# Patient Record
Sex: Female | Born: 1990 | Race: White | Hispanic: No | Marital: Single | Smoking: Never smoker
Health system: Southern US, Community
[De-identification: ages and names within clinical notes are randomized; demographics above are authoritative.]

## PROBLEM LIST (undated history)

## (undated) DIAGNOSIS — F419 Anxiety disorder, unspecified: Secondary | ICD-10-CM

## (undated) DIAGNOSIS — F439 Reaction to severe stress, unspecified: Secondary | ICD-10-CM

## (undated) HISTORY — DX: Reaction to severe stress, unspecified: F43.9

## (undated) HISTORY — DX: Anxiety disorder, unspecified: F41.9

## (undated) HISTORY — PX: BREAST EXCISIONAL BIOPSY: SUR124

## (undated) HISTORY — PX: WISDOM TOOTH EXTRACTION: SHX21

---
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
## 2014-09-28 ENCOUNTER — Emergency Department: Payer: Self-pay | Admitting: Emergency Medicine

## 2016-08-23 DIAGNOSIS — F439 Reaction to severe stress, unspecified: Secondary | ICD-10-CM

## 2016-08-23 HISTORY — DX: Reaction to severe stress, unspecified: F43.9

## 2016-12-02 ENCOUNTER — Ambulatory Visit (INDEPENDENT_AMBULATORY_CARE_PROVIDER_SITE_OTHER): Payer: Managed Care, Other (non HMO)

## 2016-12-02 DIAGNOSIS — Z3042 Encounter for surveillance of injectable contraceptive: Secondary | ICD-10-CM | POA: Diagnosis not present

## 2016-12-02 MED ORDER — MEDROXYPROGESTERONE ACETATE 150 MG/ML IM SUSP
150.0000 mg | Freq: Once | INTRAMUSCULAR | Status: AC
  Administered 2016-12-02: 150 mg via INTRAMUSCULAR

## 2017-02-21 ENCOUNTER — Ambulatory Visit (INDEPENDENT_AMBULATORY_CARE_PROVIDER_SITE_OTHER): Payer: Managed Care, Other (non HMO)

## 2017-02-21 DIAGNOSIS — Z3042 Encounter for surveillance of injectable contraceptive: Secondary | ICD-10-CM | POA: Diagnosis not present

## 2017-02-21 MED ORDER — MEDROXYPROGESTERONE ACETATE 150 MG/ML IM SUSP
150.0000 mg | Freq: Once | INTRAMUSCULAR | Status: AC
  Administered 2017-02-21: 150 mg via INTRAMUSCULAR

## 2017-04-07 LAB — LIPID PANEL
Cholesterol: 124 (ref 0–200)
HDL: 42 (ref 35–70)
LDL Cholesterol: 71
Triglycerides: 55 (ref 40–160)

## 2017-04-07 LAB — BASIC METABOLIC PANEL
Creatinine: 0.9 (ref 0.5–1.1)
Glucose: 89

## 2017-04-07 LAB — HEMOGLOBIN A1C: Hemoglobin A1C: 5.5

## 2017-05-09 ENCOUNTER — Ambulatory Visit (INDEPENDENT_AMBULATORY_CARE_PROVIDER_SITE_OTHER): Payer: Managed Care, Other (non HMO) | Admitting: Family Medicine

## 2017-05-09 ENCOUNTER — Encounter: Payer: Self-pay | Admitting: Family Medicine

## 2017-05-09 VITALS — BP 102/60 | HR 87 | Temp 97.8°F | Resp 16 | Ht 65.5 in | Wt 151.0 lb

## 2017-05-09 DIAGNOSIS — Z7689 Persons encountering health services in other specified circumstances: Secondary | ICD-10-CM

## 2017-05-09 DIAGNOSIS — F411 Generalized anxiety disorder: Secondary | ICD-10-CM | POA: Insufficient documentation

## 2017-05-09 DIAGNOSIS — F329 Major depressive disorder, single episode, unspecified: Secondary | ICD-10-CM | POA: Insufficient documentation

## 2017-05-09 DIAGNOSIS — F419 Anxiety disorder, unspecified: Secondary | ICD-10-CM | POA: Diagnosis not present

## 2017-05-09 DIAGNOSIS — F321 Major depressive disorder, single episode, moderate: Secondary | ICD-10-CM

## 2017-05-09 MED ORDER — BUSPIRONE HCL 5 MG PO TABS: 5.0000 mg | ORAL_TABLET | Freq: Three times a day (TID) | ORAL | 2 refills | Status: DC

## 2017-05-09 MED ORDER — CITALOPRAM HYDROBROMIDE 20 MG PO TABS: 20.0000 mg | ORAL_TABLET | Freq: Every day | ORAL | 3 refills | Status: DC

## 2017-05-09 NOTE — Patient Instructions (Signed)

## 2017-05-09 NOTE — Progress Notes (Signed)
   Patient: Stefanie Fitzgerald Female    DOB: 05/12/1991   26 y.o.   MRN: [id number] Visit Date: 05/09/2017  Today's Provider: Angela Bacigalupo, MD   Chief Complaint  Patient presents with  . Establish Care  . Anxiety   Subjective:       Establish Care Evaleigh presents to establish care. She see's [facility] for GYN and contraception. Her main complaint today is panic attacks.  Anxiety  Presents for initial visit. Episode onset: x 1 month. The problem has been gradually worsening. Symptoms include chest pain (with panic attacks), compulsions (restless legs and having to move them), confusion, decreased concentration, depressed mood, dizziness, excessive worry, hyperventilation, insomnia, irritability, muscle tension, nausea, nervous/anxious behavior, palpitations (with panic), panic and restlessness. Patient reports no dry mouth, feeling of choking, malaise, obsessions, shortness of breath or suicidal ideas. Primary symptoms comment: pt is also c/o hypersomnia and fatigue. The severity of symptoms is moderate, causing significant distress and interfering with daily activities. The symptoms are aggravated by family issues (pt also states she is taking extra activities outside of work, which is also causing anxiety). The quality of sleep is non-restorative.   Risk factors include family history. Past treatments include nothing.   Previously able to separate and organize tasks and stress.  No longer able to do that.  Same stress levels seem more overwhelming.  Also with difficulty concentrating.  Recent episode this weekend of learning stressful news and began shaking and hyperventilating.  Normally this news would be easy to handle.  Denies SI/HI.   GAD 7 : Generalized Anxiety Score 05/09/2017  Nervous, Anxious, on Edge 3  Control/stop worrying 3  Worry too much - different things 3  Trouble relaxing 2  Restless 3  Easily annoyed or irritable 3  Afraid - awful might happen 2    Total GAD 7 Score 19  Anxiety Difficulty Very difficult    Depression screen PHQ 2/9 05/09/2017  Decreased Interest 1  Down, Depressed, Hopeless 0  PHQ - 2 Score 1  Altered sleeping 3  Tired, decreased energy 3  Change in appetite 2  Feeling bad or failure about yourself  2  Trouble concentrating 2  Moving slowly or fidgety/restless 3  Suicidal thoughts 0  PHQ-9 Score 16  Difficult doing work/chores Somewhat difficult      Allergies  Allergen Reactions  . Oxycodone Itching and Swelling     Current Outpatient Prescriptions:  .  medroxyPROGESTERone (DEPO-PROVERA) 150 MG/ML injection, Inject 150 mg into the muscle every 3 (three) months. Administered by [facility], Disp: , Rfl:  .  busPIRone (BUSPAR) 5 MG tablet, Take 1 tablet (5 mg total) by mouth 3 (three) times daily., Disp: 90 tablet, Rfl: 2 .  citalopram (CELEXA) 20 MG tablet, Take 1 tablet (20 mg total) by mouth daily., Disp: 30 tablet, Rfl: 3  Review of Systems  Constitutional: Positive for fatigue and irritability.  Respiratory: Negative for shortness of breath.   Cardiovascular: Positive for chest pain (with panic attacks) and palpitations (with panic).  Gastrointestinal: Positive for nausea.  Neurological: Positive for dizziness, weakness, light-headedness and headaches.  Psychiatric/Behavioral: Positive for confusion, decreased concentration and sleep disturbance. Negative for self-injury and suicidal ideas. The patient is nervous/anxious and has insomnia.     Social History  Substance Use Topics  . Smoking status: Never Smoker  . Smokeless tobacco: Never Used  . Alcohol use Yes     Comment: 1-2 glasses of wine per month   Objective:     BP 102/60 (BP Location: Left Arm, Patient Position: Sitting, Cuff Size: Normal)   Pulse 87   Temp 97.8 F (36.6 C) (Oral)   Resp 16   Ht 5' 5.5" (1.664 m)   Wt 151 lb (68.5 kg)   SpO2 99%   BMI 24.75 kg/m  Vitals:   05/09/17 1414  BP: 102/60  Pulse: 87   Resp: 16  Temp: 97.8 F (36.6 C)  TempSrc: Oral  SpO2: 99%  Weight: 151 lb (68.5 kg)  Height: 5' 5.5" (1.664 m)     Physical Exam  Constitutional: She is oriented to person, place, and time. She appears well-developed and well-nourished. No distress.  HENT:  Head: Normocephalic and atraumatic.  Right Ear: External ear normal.  Left Ear: External ear normal.  Mouth/Throat: Oropharynx is clear and moist.  Eyes: Conjunctivae are normal. No scleral icterus.  Neck: Neck supple. No thyromegaly present.  Cardiovascular: Normal rate, regular rhythm, normal heart sounds and intact distal pulses.   No murmur heard. Pulmonary/Chest: Effort normal and breath sounds normal. No respiratory distress. She has no wheezes. She has no rales.  Abdominal: Soft. She exhibits no distension. There is no tenderness.  Musculoskeletal: She exhibits no edema or deformity.  Lymphadenopathy:    She has no cervical adenopathy.  Neurological: She is alert and oriented to person, place, and time.  Skin: Skin is warm and dry. No rash noted.  Psychiatric: Her behavior is normal. Thought content normal.  +anxious appearing  Vitals reviewed.       Assessment & Plan:      Problem List Items Addressed This Visit      Other   Anxiety    Severe GAD with panic attacks Referral to psychology Start Celexa 20 mg daily as above Also start Buspar 5mg TID prn to help with panic attacks F/u in1 month and consider dose increase Check for any medical causes with TSH, CMP, CBC      Relevant Medications   citalopram (CELEXA) 20 MG tablet   busPIRone (BUSPAR) 5 MG tablet   Other Relevant Orders   TSH   CBC w/Diff/Platelet   CMP14+EGFR   Ambulatory referral to Psychology   Depression, major, single episode, moderate (HCC)    PHQ 9 score in the moderately severe category Discussed starting SSRI Patient agreeable to starting Celexa 20mg daily Discussed potential side effects F/u in 1 month Contracted for  safety regarding possibility of increased suicidality      Relevant Medications   citalopram (CELEXA) 20 MG tablet   busPIRone (BUSPAR) 5 MG tablet   Other Relevant Orders   TSH   CBC w/Diff/Platelet   CMP14+EGFR   Ambulatory referral to Psychology    Other Visit Diagnoses    Encounter to establish care    -  Primary          The entirety of the information documented in the History of Present Illness, Review of Systems and Physical Exam were personally obtained by me. Portions of this information were initially documented by Emily Ratchford, CMA and reviewed by me for thoroughness and accuracy.     Angela Bacigalupo, MD  [facility] 

## 2017-05-09 NOTE — Assessment & Plan Note (Addendum)
Severe GAD with panic attacks Referral to psychology Start Celexa 20 mg daily as above Also start Buspar  TID prn to help with panic attacks F/u in1 month and consider dose increase Check for any medical causes with TSH, CMP, CBC

## 2017-05-09 NOTE — Assessment & Plan Note (Signed)
PHQ 9 score in the moderately severe category Discussed starting SSRI Patient agreeable to starting Celexa  daily Discussed potential side effects F/u in 1 month Contracted for safety regarding possibility of increased suicidality

## 2017-05-12 ENCOUNTER — Encounter: Payer: Self-pay | Admitting: Family Medicine

## 2017-05-14 LAB — CMP14+EGFR
ALT: 9 IU/L (ref 0–32)
AST: 15 IU/L (ref 0–40)
Albumin/Globulin Ratio: 1.8 (ref 1.2–2.2)
Albumin: 4.4 g/dL (ref 3.5–5.5)
Alkaline Phosphatase: 63 IU/L (ref 39–117)
BUN/Creatinine Ratio: 11 (ref 9–23)
BUN: 11 mg/dL (ref 6–20)
Bilirubin Total: 0.7 mg/dL (ref 0.0–1.2)
CO2: 21 mmol/L (ref 20–29)
Calcium: 9.3 mg/dL (ref 8.7–10.2)
Chloride: 104 mmol/L (ref 96–106)
Creatinine, Ser: 0.99 mg/dL (ref 0.57–1.00)
GFR calc Af Amer: 90 mL/min/{1.73_m2} (ref >59)
GFR calc non Af Amer: 78 mL/min/{1.73_m2} (ref >59)
Globulin, Total: 2.4 g/dL (ref 1.5–4.5)
Glucose: 96 mg/dL (ref 65–99)
Potassium: 4.1 mmol/L (ref 3.5–5.2)
Sodium: 139 mmol/L (ref 134–144)
Total Protein: 6.8 g/dL (ref 6.0–8.5)

## 2017-05-14 LAB — CBC WITH DIFFERENTIAL/PLATELET
Basophils Absolute: 0 10*3/uL (ref 0.0–0.2)
Basos: 0 %
EOS (ABSOLUTE): 0.1 10*3/uL (ref 0.0–0.4)
Eos: 1 %
Hematocrit: 38.3 % (ref 34.0–46.6)
Hemoglobin: 12.9 g/dL (ref 11.1–15.9)
Immature Grans (Abs): 0 10*3/uL (ref 0.0–0.1)
Immature Granulocytes: 0 %
Lymphocytes Absolute: 2.6 10*3/uL (ref 0.7–3.1)
Lymphs: 34 %
MCH: 28.5 pg (ref 26.6–33.0)
MCHC: 33.7 g/dL (ref 31.5–35.7)
MCV: 85 fL (ref 79–97)
Monocytes Absolute: 0.5 10*3/uL (ref 0.1–0.9)
Monocytes: 7 %
Neutrophils Absolute: 4.4 10*3/uL (ref 1.4–7.0)
Neutrophils: 58 %
Platelets: 232 10*3/uL (ref 150–379)
RBC: 4.53 x10E6/uL (ref 3.77–5.28)
RDW: 13.8 % (ref 12.3–15.4)
WBC: 7.7 10*3/uL (ref 3.4–10.8)

## 2017-05-14 LAB — TSH: TSH: 1.5 u[IU]/mL (ref 0.450–4.500)

## 2017-05-16 ENCOUNTER — Telehealth: Payer: Self-pay

## 2017-05-16 NOTE — Telephone Encounter (Signed)
-----   Message from Erasmo Downer, MD sent at 05/16/2017  8:28 AM EDT ----- Normal labs - Blood counts, kidney function, liver function, electrolytes, Thyroid function, A1c (so no diabetes), and cholesterol.  Erasmo Downer, MD, MPH [facility] 05/16/2017 8:28 AM

## 2017-05-16 NOTE — Telephone Encounter (Signed)
Pt advised as below

## 2017-05-19 ENCOUNTER — Telehealth: Payer: Self-pay | Admitting: Obstetrics and Gynecology

## 2017-05-19 ENCOUNTER — Ambulatory Visit (INDEPENDENT_AMBULATORY_CARE_PROVIDER_SITE_OTHER): Payer: Managed Care, Other (non HMO)

## 2017-05-19 DIAGNOSIS — Z3042 Encounter for surveillance of injectable contraceptive: Secondary | ICD-10-CM | POA: Diagnosis not present

## 2017-05-19 MED ORDER — MEDROXYPROGESTERONE ACETATE 150 MG/ML IM SUSP: 150.0000 mg | INTRAMUSCULAR | 0 refills | Status: DC

## 2017-05-19 MED ORDER — MEDROXYPROGESTERONE ACETATE 150 MG/ML IM SUSP
150.0000 mg | Freq: Once | INTRAMUSCULAR | Status: AC
  Administered 2017-05-19: 150 mg via INTRAMUSCULAR

## 2017-05-19 NOTE — Telephone Encounter (Signed)
One refill sent in

## 2017-05-19 NOTE — Telephone Encounter (Signed)
Patient needs refill on depo, scheduled for annual/depo on 12/17.

## 2017-06-06 ENCOUNTER — Ambulatory Visit (INDEPENDENT_AMBULATORY_CARE_PROVIDER_SITE_OTHER): Payer: Managed Care, Other (non HMO) | Admitting: Family Medicine

## 2017-06-06 ENCOUNTER — Encounter: Payer: Self-pay | Admitting: Family Medicine

## 2017-06-06 DIAGNOSIS — F321 Major depressive disorder, single episode, moderate: Secondary | ICD-10-CM | POA: Diagnosis not present

## 2017-06-06 DIAGNOSIS — F419 Anxiety disorder, unspecified: Secondary | ICD-10-CM

## 2017-06-06 MED ORDER — CITALOPRAM HYDROBROMIDE 20 MG PO TABS: 20.0000 mg | ORAL_TABLET | Freq: Every day | ORAL | 3 refills | Status: DC

## 2017-06-06 NOTE — Assessment & Plan Note (Signed)
Much improved as per PHQ9 score No SI/HI Continue Celexa daily Follow-up in 3 months

## 2017-06-06 NOTE — Progress Notes (Signed)
Patient: Stefanie Fitzgerald Female    DOB: 1990/09/22   26 y.o.   MRN: [id number] Visit Date: 06/07/2017  Today's Provider: Shirlee Latch, MD   Chief Complaint  Patient presents with  . Anxiety   Subjective:    Anxiety  Presents for follow-up (She was last seen 05/09/2017, and was started on Celexa 20 mg, as well as Buspar 5 mg TID PRN for panic attacks.) visit. Symptoms include compulsions (improving), decreased concentration (improving), insomnia (occasionally), irritability (increased irritability per mother) and nervous/anxious behavior (improved). Patient reports no chest pain, confusion, depressed mood, dizziness, dry mouth, excessive worry, feeling of choking, hyperventilation, malaise, muscle tension, nausea, obsessions, palpitations, panic, restlessness (pt reports her hands were "shaking in a high-intense situation", which occured once.), shortness of breath or suicidal ideas. The severity of symptoms is mild (pt states this is "a lot better"). The quality of sleep is good.   Compliance with medications: pt states the pharmacy did not fill the Buspar, but she has not had a severe panic attack since starting Celexa. Treatment side effects: decreased appetite and weight loss. Pt states she believes this could be a decrease in stress eating.   Panic attacks much improved.  Only one episode of panic and it was mild.  She was able to get help with major medical issue for one of her animals.  States that maternal aunt's grandson was recently diagnosed with Hereditary hemachromatosis. She states that her whole family is doing genetic testing for this. She's not very concerned that she may have this given the very distant relationship between her and this distant cousin.    Allergies  Allergen Reactions  . Oxycodone Itching and Swelling     Current Outpatient Prescriptions:  .  citalopram (CELEXA) 20 MG tablet, Take 1 tablet (20 mg total) by mouth daily., Disp:  90 tablet, Rfl: 3 .  medroxyPROGESTERone (DEPO-PROVERA) 150 MG/ML injection, Inject 1 mL (150 mg total) into the muscle every 3 (three) months. Administered by [facility], Disp: 1 mL, Rfl: 0  Review of Systems  Constitutional: Positive for irritability (increased irritability per mother).  Respiratory: Negative for shortness of breath.   Cardiovascular: Negative for chest pain and palpitations.  Gastrointestinal: Negative for nausea.  Neurological: Negative for dizziness.  Psychiatric/Behavioral: Positive for decreased concentration (improving). Negative for confusion and suicidal ideas. The patient is nervous/anxious (improved) and has insomnia (occasionally).     Social History  Substance Use Topics  . Smoking status: Never Smoker  . Smokeless tobacco: Never Used  . Alcohol use Yes     Comment: 1-2 glasses of wine per month   Objective:   BP 94/60 (BP Location: Left Arm, Patient Position: Sitting, Cuff Size: Normal)   Pulse 68   Temp 98.1 F (36.7 C) (Oral)   Resp 16   Wt 145 lb (65.8 kg)   BMI 23.76 kg/m  Vitals:   06/06/17 1613  BP: 94/60  Pulse: 68  Resp: 16  Temp: 98.1 F (36.7 C)  TempSrc: Oral  Weight: 145 lb (65.8 kg)     Physical Exam  Constitutional: She is oriented to person, place, and time. She appears well-developed and well-nourished. No distress.  Cardiovascular: Normal rate, regular rhythm and normal heart sounds.   No murmur heard. Pulmonary/Chest: Effort normal and breath sounds normal. No respiratory distress. She has no wheezes. She has no rales.  Neurological: She is alert and oriented to person, place, and time.  Psychiatric: She has a  normal mood and affect. Her behavior is normal.  Vitals reviewed.  Depression screen [facility] 2/9 06/06/2017 05/09/2017 05/09/2017  Decreased Interest 0 1 1  Down, Depressed, Hopeless 0 0 0  PHQ - 2 Score 0 1 1  Altered sleeping 1 3 -  Tired, decreased energy 0 3 3  Change in appetite Feeling bad or  failure about yourself  Trouble concentrating 0 2 2  Moving slowly or fidgety/restless Suicidal thoughts 0 0 0  PHQ-9 Score 4 16 -  Difficult doing work/chores - Somewhat difficult Somewhat difficult   GAD 7 : Generalized Anxiety Score 06/06/2017 05/09/2017  Nervous, Anxious, on Edge 0 3  Control/stop worrying 0 3  Worry too much - different things 0 3  Trouble relaxing 0 2  Restless 1 3  Easily annoyed or irritable 0 3  Afraid - awful might happen 0 2  Total GAD 7 Score 1 19  Anxiety Difficulty Not difficult at all Very difficult       Assessment & Plan:      Problem List Items Addressed This Visit      Other   Anxiety    Previously severe now well controlled Panic attacks are now well controlled as well Continue Celexa 20 mg daily Advised to consider therapy Given that she is well controlled and has not started BuSpar, may discontinue and not start this medication Follow-up in 3 months      Relevant Medications   citalopram (CELEXA) 20 MG tablet   Depression, major, single episode, moderate (HCC)    Much improved as per PHQ9 score No SI/HI Continue Celexa daily Follow-up in 3 months      Relevant Medications   citalopram (CELEXA) 20 MG tablet          Return in about 3 months (around 09/06/2017) for CPE.  The entirety of the information documented in the History of Present Illness, Review of Systems and Physical Exam were personally obtained by me. Portions of this information were initially documented by Irving Burton Ratchford, CMA and reviewed by me for thoroughness and accuracy.     Shirlee Latch, MD  [facility]

## 2017-06-06 NOTE — Assessment & Plan Note (Signed)
Previously severe now well controlled Panic attacks are now well controlled as well Continue Celexa 20 mg daily Advised to consider therapy Given that she is well controlled and has not started BuSpar, may discontinue and not start this medication Follow-up in 3 months

## 2017-06-21 ENCOUNTER — Encounter: Payer: Self-pay | Admitting: Psychiatry

## 2017-06-21 ENCOUNTER — Ambulatory Visit (INDEPENDENT_AMBULATORY_CARE_PROVIDER_SITE_OTHER): Payer: 59 | Admitting: Psychiatry

## 2017-06-21 VITALS — BP 99/66 | HR 84 | Temp 98.9°F | Wt 144.0 lb

## 2017-06-21 DIAGNOSIS — F411 Generalized anxiety disorder: Secondary | ICD-10-CM

## 2017-06-21 DIAGNOSIS — F5105 Insomnia due to other mental disorder: Secondary | ICD-10-CM

## 2017-06-21 DIAGNOSIS — F410 Panic disorder [episodic paroxysmal anxiety] without agoraphobia: Secondary | ICD-10-CM | POA: Diagnosis not present

## 2017-06-21 MED ORDER — HYDROXYZINE PAMOATE 25 MG PO CAPS: 25.0000 mg | ORAL_CAPSULE | Freq: Two times a day (BID) | ORAL | 1 refills | Status: DC | PRN

## 2017-06-21 MED ORDER — TRAZODONE HCL 50 MG PO TABS: 25.0000 mg | ORAL_TABLET | Freq: Every evening | ORAL | 1 refills | Status: DC | PRN

## 2017-06-21 NOTE — Progress Notes (Signed)
Psychiatric Initial Adult Assessment   Patient Identification: Stefanie Fitzgerald MRN:  [id number] Date of Evaluation:  06/21/2017 Referral Source: Shirlee Latch MD Chief Complaint:  " I have anxiety attacks.' Chief Complaint    Establish Care; Stress; Insomnia; Panic Attack; Anxiety; Depression     Visit Diagnosis:    ICD-10-CM   1. GAD (generalized anxiety disorder) F41.1   2. Panic disorder F41.0 hydrOXYzine (VISTARIL) 25 MG capsule  3. Insomnia due to mental disorder F51.05 traZODone (DESYREL) 50 MG tablet     History of Present Illness: The right is a 26 year old Caucasian female who is single, employed, lives in [place], has a history of anxiety symptoms who was referred by her primary medical doctor for worsening anxiety attacks.  She is here to establish care.  Maddux reports that she started having anxiety symptoms since the past 1 year or so.  She has been noticing her symptoms as worsening since this past summer.  She reports her symptoms as hyperventilating, feeling restless, feeling fidgety, and very tensed.  Her symptoms peaks in a few minutes and can last for 15 minutes or so.  She reports that when she started having anxiety attacks she used to worry constantly about having another attack.  She would also avoid social situations because she was worried about having attacks.  She reports that she is usually an extrovert.  However after she started having anxiety attacks she started being withdrawn and wanted to avoid people in general.  She notes her stressors as her family, as well as her job situation.  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  However a year ago, her job position was changed to HR.  She reports that soon after that she started getting anxious.  She reports that she does not enjoy her work anymore.  She is currently looking for another position.  She has already talked to her supervisor.  She reports  sleep issues.  She reports that she sleeps fair up to 4 days a week.  But other times she has a lot of racing thoughts and is unable to stay still.  She she used to take NyQuil over-the-counter.  She denies any manic or hypomanic symptoms.  She denies any perceptual disturbances.She denies hx of trauma.  She denies any suicidal thoughts.  She denies any substance abuse issues.  She is currently on Celexa 20 mg which was started by her family medical doctor.  She reports the medication is actually helpful.  Her panic symptoms are not full-blown anymore.  And they are less frequent.  She denies any side effects to her medications.  Associated Signs/Symptoms: Depression Symptoms:  insomnia, difficulty concentrating, (Hypo) Manic Symptoms:  denies Anxiety Symptoms:  Excessive Worry, Panic Symptoms, Psychotic Symptoms:  denies PTSD Symptoms: Negative  Past Psychiatric History: She has a history of anxiety, was started on treatment by her family medical doctor.  She denies past history of suicide attempts.  She denies past history of inpatient mental health admissions.  Previous Psychotropic Medications: Yes  , buspar, celexa  Substance Abuse History in the last 12 months:  No.  Consequences of Substance Abuse: Negative  Past Medical History:  Past Medical History:  Diagnosis Date  . Anxiety     Past Surgical History:  Procedure Laterality Date  . 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 TONSILLECTOMY  1996    Family Psychiatric History: Mother has history of depression and anxiety, brother  has history of drug abuse, depression, anxiety and father has anger issues, alcoholism.  Family History:  Family History  Problem Relation Age of Onset  . Depression Mother   . Anxiety disorder Mother   . Healthy Father   . Anxiety disorder Sister   . Depression Brother   . Anxiety disorder Brother   . Lung cancer Maternal Grandfather        smoker  . Breast cancer Neg Hx   .  Colon cancer Neg Hx     Social History:   Social History   Social History  . Marital status: Single    Spouse name: N/A  . Number of children: 0  . Years of education: bachelor's   Occupational History  .  Lab [employer]   Social History Main Topics  . Smoking status: Never Smoker  . Smokeless tobacco: Never Used  . Alcohol use No     Comment: 1-2 glasses of wine per month  . Drug use: No  . Sexual activity: Not Currently    Birth control/ protection: Injection   Other Topics Concern  . None   Social History Narrative  . None    Additional Social History: Raised by both parents.  She has 1 brother and 1 sister.  She graduated college with a degree in business administration and Architectural technologisthuman resource management.  She is single.  She has a lot of friends.  She has 2 cats and a family of squirrels.  She reports she enjoys her animals. 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  Allergies:   Allergies  Allergen Reactions  . Oxycodone Itching and Swelling    Metabolic Disorder Labs: Lab Results  Component Value Date   HGBA1C 5.5 04/07/2017   No results found for: PROLACTIN Lab Results  Component Value Date   CHOL 124 04/07/2017   TRIG 55 04/07/2017   HDL 42 04/07/2017   LDLCALC 71 04/07/2017     Current Medications: Current Outpatient Prescriptions  Medication Sig Dispense Refill  . citalopram (CELEXA) 20 MG tablet Take 1 tablet (20 mg total) by mouth daily. 90 tablet 3  . medroxyPROGESTERone (DEPO-PROVERA) 150 MG/ML injection Inject 1 mL (150 mg total) into the muscle every 3 (three) months. [facility] 1 mL 0  . hydrOXYzine (VISTARIL) 25 MG capsule Take 1 capsule (25 mg total) by mouth 2 (two) times daily as needed. 45 capsule 1  . traZODone (DESYREL) 50 MG tablet Take 0.5-1 tablets (25-50 mg total) by mouth at bedtime as needed for sleep. 30 tablet 1   No current facility-administered medications for this visit.      Neurologic: Headache: No Seizure: No Paresthesias:No  Musculoskeletal: Strength & Muscle Tone: within normal limits Gait & Station: normal Patient leans: N/A  Psychiatric Specialty Exam: Review of Systems  Psychiatric/Behavioral: Positive for depression. The patient is nervous/anxious and has insomnia.   All other systems reviewed and are negative.   Blood pressure 99/66, pulse 84, temperature 98.9 F (37.2 C), temperature source Oral, weight 144 lb (65.3 kg).Body mass index is 23.6 kg/m.  General Appearance: Casual  Eye Contact:  Fair  Speech:  Clear and Coherent  Volume:  Normal  Mood:  Anxious and Depressed  Affect:  Congruent  Thought Process:  Goal Directed and Descriptions of Associations: Intact  Orientation:  Full (Time, Place, and Person)  Thought Content:  Rumination  Suicidal Thoughts:  No  Homicidal Thoughts:  No  Memory:  Immediate;  Fair Recent;   Fair Remote;   Fair  Judgement:  Fair  Insight:  Fair  Psychomotor Activity:  Normal  Concentration:  Concentration: Fair and Attention Span: Fair  Recall:  [employer] of Knowledge:Fair  Language: Fair  Akathisia:  No  Handed:  Right  AIMS (if indicated):  NA  Assets:  Communication Skills Desire for Improvement  ADL's:  Intact  Cognition: WNL  Sleep:  Poor to fair    Treatment Plan Summary: Calkin is a 26 year old Caucasian female who has a history of anxiety attacks, presented to the clinic today to establish care.  She has stressors of family issues, as well as job stressors.  She is currently compliant on her medication which is actually helpful.  She is also open to start psychotherapy.  She is a good candidate for outpatient treatment. Medication management and Plan see below  Plan For anxiety symptoms Continue Celexa 20 mg p.o. daily Add Vistaril 25 mg p.o. twice daily as needed Refer for CBT.  For panic attacks Vistaril 25 mg p.o. twice daily as needed. CBT.  For insomnia: Trazodone  25 to 50 mg po qhs prn.  Reviewed labs in the EHR, TSH within normal limits( 05/13/2017)  Discussed medications, provided medication education, provided handouts.  Follow-up in 4 weeks.  More than 50 % of the time was spent for psychoeducation and supportive psychotherapy and care coordination.  This note was generated in part or whole with voice recognition software. Voice recognition is usually quite accurate but there are transcription errors that can and very often do occur. I apologize for any typographical errors that were not detected and corrected.      Jomarie Longs, MD 10/30/20182:48 PM

## 2017-06-21 NOTE — Patient Instructions (Signed)
Hydroxyzine capsules or tablets What is this medicine? HYDROXYZINE (hye DROX i zeen) is an antihistamine. This medicine is used to treat allergy symptoms. It is also used to treat anxiety and tension. This medicine can be used with other medicines to induce sleep before surgery. This medicine may be used for other purposes; ask your health care provider or pharmacist if you have questions. COMMON BRAND NAME(S): ANX, Atarax, Rezine, Vistaril What should I tell my health care provider before I take this medicine? They need to know if you have any of these conditions: -any chronic illness -difficulty passing urine -glaucoma -heart disease -kidney disease -liver disease -lung disease -an unusual or allergic reaction to hydroxyzine, cetirizine, other medicines, foods, dyes, or preservatives -pregnant or trying to get pregnant -breast-feeding How should I use this medicine? Take this medicine by mouth with a full glass of water. Follow the directions on the prescription label. You may take this medicine with food or on an empty stomach. Take your medicine at regular intervals. Do not take your medicine more often than directed. Talk to your pediatrician regarding the use of this medicine in children. Special care may be needed. While this drug may be prescribed for children as young as 6 years of age for selected conditions, precautions do apply. Patients over 65 years old may have a stronger reaction and need a smaller dose. Overdosage: If you think you have taken too much of this medicine contact a poison control center or emergency room at once. NOTE: This medicine is only for you. Do not share this medicine with others. What if I miss a dose? If you miss a dose, take it as soon as you can. If it is almost time for your next dose, take only that dose. Do not take double or extra doses. What may interact with this medicine? -alcohol -barbiturate medicines for sleep or seizures -medicines for  colds, allergies -medicines for depression, anxiety, or emotional disturbances -medicines for pain -medicines for sleep -muscle relaxants This list may not describe all possible interactions. Give your health care provider a list of all the medicines, herbs, non-prescription drugs, or dietary supplements you use. Also tell them if you smoke, drink alcohol, or use illegal drugs. Some items may interact with your medicine. What should I watch for while using this medicine? Tell your doctor or health care professional if your symptoms do not improve. You may get drowsy or dizzy. Do not drive, use machinery, or do anything that needs mental alertness until you know how this medicine affects you. Do not stand or sit up quickly, especially if you are an older patient. This reduces the risk of dizzy or fainting spells. Alcohol may interfere with the effect of this medicine. Avoid alcoholic drinks. Your mouth may get dry. Chewing sugarless gum or sucking hard candy, and drinking plenty of water may help. Contact your doctor if the problem does not go away or is severe. This medicine may cause dry eyes and blurred vision. If you wear contact lenses you may feel some discomfort. Lubricating drops may help. See your eye doctor if the problem does not go away or is severe. If you are receiving skin tests for allergies, tell your doctor you are using this medicine. What side effects may I notice from receiving this medicine? Side effects that you should report to your doctor or health care professional as soon as possible: -fast or irregular heartbeat -difficulty passing urine -seizures -slurred speech or confusion -tremor Side effects that   usually do not require medical attention (report to your doctor or health care professional if they continue or are bothersome): -constipation -drowsiness -fatigue -headache -stomach upset This list may not describe all possible side effects. Call your doctor for  medical advice about side effects. You may report side effects to FDA at 1-800-FDA-1088. Where should I keep my medicine? Keep out of the reach of children. Store at room temperature between 15 and 30 degrees C (59 and 86 degrees F). Keep container tightly closed. Throw away any unused medicine after the expiration date. NOTE: This sheet is a summary. It may not cover all possible information. If you have questions about this medicine, talk to your doctor, pharmacist, or health care provider.  2018 Elsevier/Gold Standard (2007-12-22 14:50:59) Trazodone tablets What is this medicine? TRAZODONE (TRAZ oh done) is used to treat depression. This medicine may be used for other purposes; ask your health care provider or pharmacist if you have questions. COMMON BRAND NAME(S): Desyrel What should I tell my health care provider before I take this medicine? They need to know if you have any of these conditions: -attempted suicide or thinking about it -bipolar disorder -bleeding problems -glaucoma -heart disease, or previous heart attack -irregular heart beat -kidney or liver disease -low levels of sodium in the blood -an unusual or allergic reaction to trazodone, other medicines, foods, dyes or preservatives -pregnant or trying to get pregnant -breast-feeding How should I use this medicine? Take this medicine by mouth with a glass of water. Follow the directions on the prescription label. Take this medicine shortly after a meal or a light snack. Take your medicine at regular intervals. Do not take your medicine more often than directed. Do not stop taking this medicine suddenly except upon the advice of your doctor. Stopping this medicine too quickly may cause serious side effects or your condition may worsen. A special MedGuide will be given to you by the pharmacist with each prescription and refill. Be sure to read this information carefully each time. Talk to your pediatrician regarding the use  of this medicine in children. Special care may be needed. Overdosage: If you think you have taken too much of this medicine contact a poison control center or emergency room at once. NOTE: This medicine is only for you. Do not share this medicine with others. What if I miss a dose? If you miss a dose, take it as soon as you can. If it is almost time for your next dose, take only that dose. Do not take double or extra doses. What may interact with this medicine? Do not take this medicine with any of the following medications: -certain medicines for fungal infections like fluconazole, itraconazole, ketoconazole, posaconazole, voriconazole -cisapride -dofetilide -dronedarone -linezolid -MAOIs like Carbex, Eldepryl, Marplan, Nardil, and Parnate -mesoridazine -methylene blue (injected into a vein) -pimozide -saquinavir -thioridazine -ziprasidone This medicine may also interact with the following medications: -alcohol -antiviral medicines for HIV or AIDS -aspirin and aspirin-like medicines -barbiturates like phenobarbital -certain medicines for blood pressure, heart disease, irregular heart beat -certain medicines for depression, anxiety, or psychotic disturbances -certain medicines for migraine headache like almotriptan, eletriptan, frovatriptan, naratriptan, rizatriptan, sumatriptan, zolmitriptan -certain medicines for seizures like carbamazepine and phenytoin -certain medicines for sleep -certain medicines that treat or prevent blood clots like dalteparin, enoxaparin, warfarin -digoxin -fentanyl -lithium -NSAIDS, medicines for pain and inflammation, like ibuprofen or naproxen -other medicines that prolong the QT interval (cause an abnormal heart rhythm) -rasagiline -supplements like St. John's wort, kava kava,   valerian -tramadol -tryptophan This list may not describe all possible interactions. Give your health care provider a list of all the medicines, herbs, non-prescription  drugs, or dietary supplements you use. Also tell them if you smoke, drink alcohol, or use illegal drugs. Some items may interact with your medicine. What should I watch for while using this medicine? Tell your doctor if your symptoms do not get better or if they get worse. Visit your doctor or health care professional for regular checks on your progress. Because it may take several weeks to see the full effects of this medicine, it is important to continue your treatment as prescribed by your doctor. Patients and their families should watch out for new or worsening thoughts of suicide or depression. Also watch out for sudden changes in feelings such as feeling anxious, agitated, panicky, irritable, hostile, aggressive, impulsive, severely restless, overly excited and hyperactive, or not being able to sleep. If this happens, especially at the beginning of treatment or after a change in dose, call your health care professional. You may get drowsy or dizzy. Do not drive, use machinery, or do anything that needs mental alertness until you know how this medicine affects you. Do not stand or sit up quickly, especially if you are an older patient. This reduces the risk of dizzy or fainting spells. Alcohol may interfere with the effect of this medicine. Avoid alcoholic drinks. This medicine may cause dry eyes and blurred vision. If you wear contact lenses you may feel some discomfort. Lubricating drops may help. See your eye doctor if the problem does not go away or is severe. Your mouth may get dry. Chewing sugarless gum, sucking hard candy and drinking plenty of water may help. Contact your doctor if the problem does not go away or is severe. What side effects may I notice from receiving this medicine? Side effects that you should report to your doctor or health care professional as soon as possible: -allergic reactions like skin rash, itching or hives, swelling of the face, lips, or tongue -elevated mood,  decreased need for sleep, racing thoughts, impulsive behavior -confusion -fast, irregular heartbeat -feeling faint or lightheaded, falls -feeling agitated, angry, or irritable -loss of balance or coordination -painful or prolonged erections -restlessness, pacing, inability to keep still -suicidal thoughts or other mood changes -tremors -trouble sleeping -seizures -unusual bleeding or bruising Side effects that usually do not require medical attention (report to your doctor or health care professional if they continue or are bothersome): -change in sex drive or performance -change in appetite or weight -constipation -headache -muscle aches or pains -nausea This list may not describe all possible side effects. Call your doctor for medical advice about side effects. You may report side effects to FDA at 1-800-FDA-1088. Where should I keep my medicine? Keep out of the reach of children. Store at room temperature between 15 and 30 degrees C (59 to 86 degrees F). Protect from light. Keep container tightly closed. Throw away any unused medicine after the expiration date. NOTE: This sheet is a summary. It may not cover all possible information. If you have questions about this medicine, talk to your doctor, pharmacist, or health care provider.  2018 Elsevier/Gold Standard (2016-01-08 16:57:05)  

## 2017-07-11 ENCOUNTER — Ambulatory Visit: Payer: Managed Care, Other (non HMO) | Admitting: Licensed Clinical Social Worker

## 2017-07-19 ENCOUNTER — Encounter: Payer: Self-pay | Admitting: Psychiatry

## 2017-07-19 ENCOUNTER — Other Ambulatory Visit: Payer: Self-pay

## 2017-07-19 ENCOUNTER — Ambulatory Visit: Payer: 59 | Admitting: Psychiatry

## 2017-07-19 VITALS — BP 107/73 | HR 71 | Temp 98.5°F | Wt 145.6 lb

## 2017-07-19 DIAGNOSIS — F411 Generalized anxiety disorder: Secondary | ICD-10-CM

## 2017-07-19 DIAGNOSIS — F410 Panic disorder [episodic paroxysmal anxiety] without agoraphobia: Secondary | ICD-10-CM | POA: Diagnosis not present

## 2017-07-19 DIAGNOSIS — F5105 Insomnia due to other mental disorder: Secondary | ICD-10-CM | POA: Diagnosis not present

## 2017-07-19 NOTE — Progress Notes (Signed)
BH MD/PA/NP OP Progress Note  07/19/2017 3:13 PM Korene Dula  MRN:  [id number]  Chief Complaint:  Chief Complaint    Follow-up; Medication Refill    " I am ok."  HPI: Stefanie Fitzgerald is a 26 year old Caucasian female who is single, employed, lives in [place], has a history of anxiety symptoms who presented to the clinic today for a follow-up visit.  [place] today reports that she has been feeling better since the past 2-3 weeks .  She feels her anxiety symptoms are under control.  She reports she continues to stay busy at work.  But they were able to hire someone new and that has been helpful.  She reports she stays compliant on her medications.  She denies any side effects.  She takes the hydroxyzine as needed occasionally.  She reports sleep is fair.  She has been taking the trazodone couple of times a week.  She usually tries to take it on weekends because she is worried about the hangover effect in the morning.  But overall she feels better .  She reports she had a good Thanksgiving with her family.  Denies any new concerns.  Visit Diagnosis:    ICD-10-CM   1. GAD (generalized anxiety disorder) F41.1   2. 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  She denies past history of suicide attempts.  She denies past history of inpatient mental health admissions.  Past trials of BuSpar.   Past Medical History:  Past Medical History:  Diagnosis Date  . Anxiety     Past Surgical History:  Procedure Laterality Date  . 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 TONSILLECTOMY  1996    Family Psychiatric History: Mother has a history of depression and anxiety; brother has history of drug abuse, depression, anxiety ; father has anger issues and alcoholism.  Family History:  Family History  Problem Relation Age of Onset  .  Depression Mother   . Anxiety disorder Mother   . Healthy Father   . Anxiety disorder Sister   . Depression Brother   . Anxiety disorder Brother   . Lung cancer Maternal Grandfather        smoker  . Breast cancer Neg Hx   . Colon cancer Neg Hx     Social History: Raised by both parents.  She has 1 brother and 1 sister.  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  She is single.  She has a lot of friends.  She has 2 cats and a family of squirrels.  She reports she enjoys her animals .  She currently works in the [employer] department at [employer] History   Socioeconomic History  . Marital status: Single    Spouse name: None  . Number of children: 0  . Years of education: bachelor's  . Highest education level: Bachelor's degree (e.g., BA, AB, BS)  Social Needs  . Financial resource strain: Somewhat hard  . Food insecurity - worry: Never true  . Food insecurity - inability: Never true  . Transportation needs - medical: No  . Transportation needs - non-medical: No  Occupational History    Employer: [employer]    Comment: FULL TIME  Tobacco Use  . Smoking status: Never Smoker  . Smokeless tobacco: Never Used  Substance  and Sexual Activity  . Alcohol use: No    Comment: 1-2 glasses of wine per month  . Drug use: No  . Sexual activity: Not Currently    Birth control/protection: Injection  Other Topics Concern  . None  Social History Narrative  . None    Allergies:  Allergies  Allergen Reactions  . Oxycodone Itching and Swelling    Metabolic Disorder Labs: Lab Results  Component Value Date   HGBA1C 5.5 04/07/2017   No results found for: PROLACTIN Lab Results  Component Value Date   CHOL 124 04/07/2017   TRIG 55 04/07/2017   HDL 42 04/07/2017   LDLCALC 71 04/07/2017   Lab Results  Component Value Date   TSH 1.500 05/13/2017    Therapeutic Level Labs: No results found for: LITHIUM No results found for:  VALPROATE No components found for:  CBMZ  Current Medications: Current Outpatient Medications  Medication Sig Dispense Refill  . citalopram (CELEXA) 20 MG tablet Take 1 tablet (20 mg total) by mouth daily. 90 tablet 3  . hydrOXYzine (VISTARIL) 25 MG capsule Take 1 capsule (25 mg total) by mouth 2 (two) times daily as needed. 45 capsule 1  . medroxyPROGESTERone (DEPO-PROVERA) 150 MG/ML injection Inject 1 mL (150 mg total) into the muscle every 3 (three) months. Administered by [facility] 1 mL 0  . traZODone (DESYREL) 50 MG tablet Take 0.5-1 tablets (25-50 mg total) by mouth at bedtime as needed for sleep. 30 tablet 1   No current facility-administered medications for this visit.      Musculoskeletal: Strength & Muscle Tone: within normal limits Gait & Station: normal Patient leans: N/A  Psychiatric Specialty Exam: Review of Systems  Psychiatric/Behavioral: The patient is nervous/anxious (IMPROVING) and has insomnia (improving).   All other systems reviewed and are negative.   Blood pressure 107/73, pulse 71, temperature 98.5 F (36.9 C), temperature source Oral, weight 145 lb 9.6 oz (66 kg).Body mass index is 23.86 kg/m.  General Appearance: Casual  Eye Contact:  Fair  Speech:  Normal Rate  Volume:  Normal  Mood:  Anxious  Affect:  Congruent  Thought Process:  Goal Directed and Descriptions of Associations: Intact  Orientation:  Full (Time, Place, and Person)  Thought Content: Logical   Suicidal Thoughts:  No  Homicidal Thoughts:  No  Memory:  Immediate;   Fair Recent;   Fair Remote;   Fair  Judgement:  Fair  Insight:  Fair  Psychomotor Activity:  Normal  Concentration:  Concentration: Fair and Attention Span: Fair  Recall:  F[employer]Fair  Fund of Knowledge: Fair  Language: Fair  Akathisia:  No  Handed:  Right  AIMS (if indicated): NA  Assets:  Communication Skills Desire for Improvement Housing Leisure Time Physical Health Social  Support Talents/Skills Transportation  ADL's:  Intact  Cognition: WNL  Sleep:  Fair   Screenings: GAD-7     [facility]  Total GAD-7 Score  1  19    PHQ2-9     [facility]  PHQ-2 Total Score  0  1  PHQ-9 Total Score  4  16       Assessment and Plan: Maralyn SagoSarah is a 26 year old Caucasian female who has a history of anxiety attacks who presented to the clinic for a follow-up visit.  Maralyn SagoSarah reports she is currently doing  well on her medications.  We will continue medication management as noted below.  Plan For anxiety symptoms Continue Celexa 20 mg p.o. daily Vistaril 25 mg p.o. twice daily as needed Patient has been referred for CBT  Panic attacks Continue Vistaril 25 mg p.o. twice daily as needed. CBT referral.  For insomnia Trazodone 25-50 mg p.o. nightly as needed. Also discussed to take the hydroxyzine 25 mg as needed for sleep.  Discussed medications, discussed leisure activities.  Follow-up in 8 weeks or sooner if needed.  This note was generated in part or whole with voice recognition software. Voice recognition is usually quite accurate but there are transcription errors that can and very often do occur. I apologize for any typographical errors that were not detected and corrected.  More than 50 % of the time was spent for psychoeducation and supportive psychotherapy and care coordination.    Jomarie LongsSaramma Janiaya Ryser, MD 07/19/2017, 3:13 PM

## 2017-07-20 ENCOUNTER — Ambulatory Visit: Payer: Managed Care, Other (non HMO) | Admitting: Psychiatry

## 2017-08-08 ENCOUNTER — Ambulatory Visit (INDEPENDENT_AMBULATORY_CARE_PROVIDER_SITE_OTHER): Payer: Managed Care, Other (non HMO) | Admitting: Obstetrics and Gynecology

## 2017-08-08 ENCOUNTER — Encounter: Payer: Self-pay | Admitting: Obstetrics and Gynecology

## 2017-08-08 ENCOUNTER — Ambulatory Visit: Payer: 59 | Admitting: Licensed Clinical Social Worker

## 2017-08-08 VITALS — BP 100/64 | HR 78 | Ht 66.0 in | Wt 148.0 lb

## 2017-08-08 DIAGNOSIS — Z124 Encounter for screening for malignant neoplasm of cervix: Secondary | ICD-10-CM

## 2017-08-08 DIAGNOSIS — Z3042 Encounter for surveillance of injectable contraceptive: Secondary | ICD-10-CM

## 2017-08-08 DIAGNOSIS — Z01419 Encounter for gynecological examination (general) (routine) without abnormal findings: Secondary | ICD-10-CM | POA: Diagnosis not present

## 2017-08-08 LAB — HM PAP SMEAR

## 2017-08-08 MED ORDER — MEDROXYPROGESTERONE ACETATE 150 MG/ML IM SUSP: 150.0000 mg | INTRAMUSCULAR | 3 refills | Status: DC

## 2017-08-08 MED ORDER — MEDROXYPROGESTERONE ACETATE 150 MG/ML IM SUSP: 150.0000 mg | INTRAMUSCULAR | 0 refills | Status: DC

## 2017-08-08 NOTE — Patient Instructions (Signed)
I value your feedback and entrusting us with your care. If you get a  patient survey, I would appreciate you taking the time to let us know about your experience today. Thank you! 

## 2017-08-08 NOTE — Progress Notes (Signed)
PCP:  Erasmo DownerBacigalupo, Angela M, MD   Chief Complaint  Patient presents with  . Gynecologic Exam     HPI:      [name] is a 26 y.o. G0P0000 who LMP was No LMP recorded. Patient has had an injection., presents today for her annual examination.  Her menses are absent due to depo.  Dysmenorrhea none. She does not have intermenstrual bleeding.  Sex activity: not sexually active.  Last Pap: June 29, 2016  Results were: no abnormalities  Hx of STDs: chlamydia  There is no FH of breast cancer. There is no FH of ovarian cancer. The patient does do self-breast exams.  Tobacco use: The patient denies current or previous tobacco use. Alcohol use: social drinker No drug use.  Exercise: moderately active  She does get adequate calcium and Vitamin D in her diet.   Past Medical History:  Diagnosis Date  . Anxiety     Past Surgical History:  Procedure Laterality Date  . 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 TONSILLECTOMY  1996    Family History  Problem Relation Age of Onset  . Depression Mother   . Anxiety disorder Mother   . Healthy Father   . Anxiety disorder Sister   . Depression Brother   . Anxiety disorder Brother   . Lung cancer Maternal Grandfather        smoker  . Breast cancer Neg Hx   . Colon cancer Neg Hx     Social History   Socioeconomic History  . Marital status: Single    Spouse name: Not on file  . Number of children: 0  . Years of education: bachelor's  . Highest education level: Bachelor's degree (e.g., BA, AB, BS)  Social Needs  . Financial resource strain: Somewhat hard  . Food insecurity - worry: Never true  . Food insecurity - inability: Never true  . Transportation needs - medical: No  . Transportation needs - non-medical: No  Occupational History    Employer: [employer]    Comment: FULL TIME  Tobacco Use  . Smoking status: Never Smoker  . Smokeless tobacco: Never Used  Substance and Sexual Activity    . Alcohol use: No    Comment: 1-2 glasses of wine per month  . Drug use: No  . Sexual activity: Not Currently    Birth control/protection: Injection  Other Topics Concern  . Not on file  Social History Narrative  . Not on file    Current Meds  Medication Sig  . citalopram (CELEXA) 20 MG tablet Take 1 tablet (20 mg total) by mouth daily.  . hydrOXYzine (VISTARIL) 25 MG capsule Take 1 capsule (25 mg total) by mouth 2 (two) times daily as needed.  . medroxyPROGESTERone (DEPO-PROVERA) 150 MG/ML injection Inject 1 mL (150 mg total) into the muscle every 3 (three) months. [facility]  . [DISCONTINUED] medroxyPROGESTERone (DEPO-PROVERA) 150 MG/ML injection Inject 1 mL (150 mg total) into the muscle every 3 (three) months. [facility]  . [DISCONTINUED] medroxyPROGESTERone (DEPO-PROVERA) 150 MG/ML injection Inject 1 mL (150 mg total) into the muscle every 3 (three) months. [facility] by [facility]     ROS:  Review of Systems  Constitutional: Negative for fatigue, fever and unexpected weight change.  Respiratory: Negative for cough, shortness of breath and wheezing.   Cardiovascular: Negative for chest pain, palpitations and leg swelling.  Gastrointestinal: Negative for blood in stool, constipation,  diarrhea, nausea and vomiting.  Endocrine: Negative for cold intolerance, heat intolerance and polyuria.  Genitourinary: Negative for dyspareunia, dysuria, flank pain, frequency, genital sores, hematuria, menstrual problem, pelvic pain, urgency, vaginal bleeding, vaginal discharge and vaginal pain.  Musculoskeletal: Negative for back pain, joint swelling and myalgias.  Skin: Negative for rash.  Neurological: Negative for dizziness, syncope, light-headedness, numbness and headaches.  Hematological: Negative for adenopathy.  Psychiatric/Behavioral: Negative for agitation, confusion, sleep disturbance and suicidal ideas. The patient is not  nervous/anxious.      Objective: BP 100/64 (BP Location: Left Arm, Patient Position: Sitting, Cuff Size: Normal)   Pulse 78   Ht 5\' 6"  (1.676 m)   Wt 148 lb (67.1 kg)   BMI 23.89 kg/m    Physical Exam  Constitutional: She is oriented to person, place, and time. She appears well-developed and well-nourished.  Genitourinary: Vagina normal and uterus normal. There is no rash or tenderness on the right labia. There is no rash or tenderness on the left labia. No erythema or tenderness in the vagina. No vaginal discharge found. Right adnexum does not display mass and does not display tenderness. Left adnexum does not display mass and does not display tenderness. Cervix does not exhibit motion tenderness or polyp. Uterus is not enlarged or tender.  Neck: Normal range of motion. No thyromegaly present.  Cardiovascular: Normal rate, regular rhythm and normal heart sounds.  No murmur heard. Pulmonary/Chest: Effort normal and breath sounds normal. Right breast exhibits no mass, no nipple discharge, no skin change and no tenderness. Left breast exhibits no mass, no nipple discharge, no skin change and no tenderness.  Abdominal: Soft. There is no tenderness. There is no guarding.  Musculoskeletal: Normal range of motion.  Neurological: She is alert and oriented to person, place, and time. No cranial nerve deficit.  Psychiatric: She has a normal mood and affect. Her behavior is normal.  Vitals reviewed.   Assessment/Plan: Encounter for annual routine gynecological examination  Cervical cancer screening - Plan: IGP, rfx Aptima HPV ASCU  Encounter for surveillance of injectable contraceptive - Depo RF. Cont calcium.  - Plan: medroxyPROGESTERone (DEPO-PROVERA) 150 MG/ML injection, DISCONTINUED: medroxyPROGESTERone (DEPO-PROVERA) 150 MG/ML injection  Meds ordered this encounter  Medications  . DISCONTD: medroxyPROGESTERone (DEPO-PROVERA) 150 MG/ML injection    Sig: Inject 1 mL (150 mg total) into  the muscle every 3 (three) months. [facility] by [facility]    Dispense:  1 mL    Refill:  0  . medroxyPROGESTERone (DEPO-PROVERA) 150 MG/ML injection    Sig: Inject 1 mL (150 mg total) into the muscle every 3 (three) months. [facility]    Dispense:  1 mL    Refill:  3             GYN counsel adequate intake of calcium and vitamin D     F/U  Return in about 1 year (around 08/08/2018).  Daejon Lich B. Shalaunda Weatherholtz, PA-C 08/08/2017 4:35 PM

## 2017-08-09 LAB — IGP, RFX APTIMA HPV ASCU: PAP Smear Comment: 0

## 2017-08-12 ENCOUNTER — Ambulatory Visit (INDEPENDENT_AMBULATORY_CARE_PROVIDER_SITE_OTHER): Payer: Managed Care, Other (non HMO)

## 2017-08-12 DIAGNOSIS — Z3042 Encounter for surveillance of injectable contraceptive: Secondary | ICD-10-CM | POA: Diagnosis not present

## 2017-08-12 MED ORDER — MEDROXYPROGESTERONE ACETATE 150 MG/ML IM SUSP
150.0000 mg | Freq: Once | INTRAMUSCULAR | Status: AC
  Administered 2017-08-12: 150 mg via INTRAMUSCULAR

## 2017-08-30 ENCOUNTER — Ambulatory Visit: Payer: 59 | Admitting: Licensed Clinical Social Worker

## 2017-08-30 DIAGNOSIS — F410 Panic disorder [episodic paroxysmal anxiety] without agoraphobia: Secondary | ICD-10-CM | POA: Diagnosis not present

## 2017-08-30 DIAGNOSIS — F411 Generalized anxiety disorder: Secondary | ICD-10-CM | POA: Diagnosis not present
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
## 2017-09-07 ENCOUNTER — Ambulatory Visit (INDEPENDENT_AMBULATORY_CARE_PROVIDER_SITE_OTHER): Payer: Managed Care, Other (non HMO) | Admitting: Family Medicine

## 2017-09-07 ENCOUNTER — Encounter: Payer: Self-pay | Admitting: Family Medicine

## 2017-09-07 VITALS — BP 98/70 | HR 71 | Temp 98.2°F | Resp 16 | Ht 66.0 in | Wt 150.2 lb

## 2017-09-07 DIAGNOSIS — F419 Anxiety disorder, unspecified: Secondary | ICD-10-CM

## 2017-09-07 DIAGNOSIS — R42 Dizziness and giddiness: Secondary | ICD-10-CM | POA: Insufficient documentation

## 2017-09-07 DIAGNOSIS — Z0000 Encounter for general adult medical examination without abnormal findings: Secondary | ICD-10-CM | POA: Diagnosis not present

## 2017-09-07 DIAGNOSIS — F321 Major depressive disorder, single episode, moderate: Secondary | ICD-10-CM | POA: Diagnosis not present

## 2017-09-07 NOTE — Assessment & Plan Note (Signed)
Fairly well controlled No further panic attacks Possible that episodes of dizziness, diaphoresis could be related to panic attacks, but patient declines feeling stressed or anxious when these occur Continue Celexa 20mg  daily F/u in 6 months

## 2017-09-07 NOTE — Assessment & Plan Note (Signed)
Much improved Continue Celexa 20 mg daily F/u in 6 months

## 2017-09-07 NOTE — Assessment & Plan Note (Signed)
Episodic dizziness, diaphoresis, nausea, etc No correlation of symptoms to triggers No chest pain, palpitations, or SOB associated, so cardiac etiology is lower on differential  Most likely causes are hypotension due to dehydration or hypoglycemia after spikes from eating high carb meals No neuro symptoms to suggest serotonin syndrome and only taking Celexa - no other meds Advised keeping log of symptoms and checking BP and HR when episodes occur F/u in 6 wks Could consider cardiology referral for possible Holter

## 2017-09-07 NOTE — Patient Instructions (Signed)
Preventive Care 18-39 Years, Female Preventive care refers to lifestyle choices and visits with your health care provider that can promote health and wellness. What does preventive care include?  A yearly physical exam. This is also called an annual well check.  Dental exams once or twice a year.  Routine eye exams. Ask your health care provider how often you should have your eyes checked.  Personal lifestyle choices, including: ? Daily care of your teeth and gums. ? Regular physical activity. ? Eating a healthy diet. ? Avoiding tobacco and drug use. ? Limiting alcohol use. ? Practicing safe sex. ? Taking vitamin and mineral supplements as recommended by your health care provider. What happens during an annual well check? The services and screenings done by your health care provider during your annual well check will depend on your age, overall health, lifestyle risk factors, and family history of disease. Counseling Your health care provider may ask you questions about your:  Alcohol use.  Tobacco use.  Drug use.  Emotional well-being.  Home and relationship well-being.  Sexual activity.  Eating habits.  Work and work Statistician.  Method of birth control.  Menstrual cycle.  Pregnancy history.  Screening You may have the following tests or measurements:  Height, weight, and BMI.  Diabetes screening. This is done by checking your blood sugar (glucose) after you have not eaten for a while (fasting).  Blood pressure.  Lipid and cholesterol levels. These may be checked every 5 years starting at age 66.  Skin check.  Hepatitis C blood test.  Hepatitis B blood test.  Sexually transmitted disease (STD) testing.  BRCA-related cancer screening. This may be done if you have a family history of breast, ovarian, tubal, or peritoneal cancers.  Pelvic exam and Pap test. This may be done every 3 years starting at age 40. Starting at age 59, this may be done every 5  years if you have a Pap test in combination with an HPV test.  Discuss your test results, treatment options, and if necessary, the need for more tests with your health care provider. Vaccines Your health care provider may recommend certain vaccines, such as:  Influenza vaccine. This is recommended every year.  Tetanus, diphtheria, and acellular pertussis (Tdap, Td) vaccine. You may need a Td booster every 10 years.  Varicella vaccine. You may need this if you have not been vaccinated.  HPV vaccine. If you are 69 or younger, you may need three doses over 6 months.  Measles, mumps, and rubella (MMR) vaccine. You may need at least one dose of MMR. You may also need a second dose.  Pneumococcal 13-valent conjugate (PCV13) vaccine. You may need this if you have certain conditions and were not previously vaccinated.  Pneumococcal polysaccharide (PPSV23) vaccine. You may need one or two doses if you smoke cigarettes or if you have certain conditions.  Meningococcal vaccine. One dose is recommended if you are age 27-21 years and a first-year college student living in a residence hall, or if you have one of several medical conditions. You may also need additional booster doses.  Hepatitis A vaccine. You may need this if you have certain conditions or if you travel or work in places where you may be exposed to hepatitis A.  Hepatitis B vaccine. You may need this if you have certain conditions or if you travel or work in places where you may be exposed to hepatitis B.  Haemophilus influenzae type b (Hib) vaccine. You may need this if  you have certain risk factors.  Talk to your health care provider about which screenings and vaccines you need and how often you need them. This information is not intended to replace advice given to you by your health care provider. Make sure you discuss any questions you have with your health care provider. Document Released: 10/05/2001 Document Revised: 04/28/2016  Document Reviewed: 06/10/2015 Elsevier Interactive Patient Education  [employer].

## 2017-09-07 NOTE — Progress Notes (Signed)
Patient: Stefanie Fitzgerald, Female    DOB: [birth year], 27 y.o.   MRN: [id number] Visit Date: 09/07/2017  Today's Provider: Lavon Paganini, MD   I, Martha Clan, CMA, am acting as scribe for Lavon Paganini, MD.  Chief Complaint  Patient presents with  . Annual Exam   Subjective:    Annual physical exam Stefanie Fitzgerald is a 27 y.o. female who presents today for health maintenance and complete physical. She feels fairly well. She reports exercising for 30 minutes 3 days a week. She reports she is sleeping poorly. Pt states she has been falling asleep about 5:00 pm on the couch most nights, and wakes up around 3 hours later. This makes it difficult to fall back asleep at bedtime.  She states she has been feeling poorly over the last month. She is c/o fatigue, malaise, cold/clammy sweats, dizziness, lightheadedness, weakness, nausea, and headaches. Denies syncope, chest pain, SOB, palpitations. States it is difficult to climb stairs without feeling lightheaded and dizzy. This has happened once or twice.  Walking more and eating snacks through the day seems to help some.  Can happen randomly through the day.  Trying to increase water intake, but this is difficult because she does not like water.  Last pap- 08/08/2017- NIL. F/B GYN Unsure of last TDAP, but doesn't think it has been recently. ----------------------------------------------------------------- Depression screen [employer] 2/9 09/07/2017 06/06/2017 05/09/2017 05/09/2017  Decreased Interest 0 0 1 1  Down, Depressed, Hopeless 0 0 0 0  PHQ - 2 Score 0 0 1 1  Altered sleeping 3 1 3  -  Tired, decreased energy 2 0 3 3  Change in appetite 0 1 2 3   Feeling bad or failure about yourself  0 1 2 2   Trouble concentrating 0 0 2 2  Moving slowly or fidgety/restless 2 1 3 2   Suicidal thoughts 0 0 0 0  PHQ-9 Score 7 4 16  -  Difficult doing work/chores Somewhat difficult - Somewhat difficult Somewhat  difficult    GAD 7 : Generalized Anxiety Score 09/07/2017 06/06/2017 05/09/2017  Nervous, Anxious, on Edge 1 0 3  Control/stop worrying 0 0 3  Worry too much - different things 1 0 3  Trouble relaxing 0 0 2  Restless 0 1 3  Easily annoyed or irritable 1 0 3  Afraid - awful might happen 0 0 2  Total GAD 7 Score 3 1 19   Anxiety Difficulty Not difficult at all Not difficult at all Very difficult      Review of Systems  Constitutional: Positive for fatigue. Negative for activity change, appetite change, chills, diaphoresis, fever and unexpected weight change.  HENT: Negative.   Eyes: Negative.   Respiratory: Negative.   Cardiovascular: Negative.   Gastrointestinal: Negative.   Endocrine: Negative.   Genitourinary: Negative.   Musculoskeletal: Negative.   Skin: Negative.   Allergic/Immunologic: Negative.   Neurological: Positive for dizziness, weakness, light-headedness and headaches. Negative for tremors, seizures, syncope, facial asymmetry, speech difficulty and numbness.  Hematological: Negative.   Psychiatric/Behavioral: Negative.     Social History      She  reports that  has never smoked. she has never used smokeless tobacco. She reports that she drinks alcohol. She reports that she does not use drugs.       Social History   Socioeconomic History  . Marital status: Single    Spouse name: None  . Number of children: 0  . Years of education: bachelor's  .  Highest education level: Bachelor's degree (e.g., BA, AB, BS)  Social Needs  . Financial resource strain: Somewhat hard  . Food insecurity - worry: Never true  . Food insecurity - inability: Never true  . Transportation needs - medical: No  . Transportation needs - non-medical: No  Occupational History    Employer: [employer]    Comment: FULL TIME  Tobacco Use  . Smoking status: Never Smoker  . Smokeless tobacco: Never Used  Substance and Sexual Activity  . Alcohol use: Yes    Comment: 1-2 glasses of wine per  month  . Drug use: No  . Sexual activity: Not Currently    Birth control/protection: Injection  Other Topics Concern  . None  Social History Narrative  . None    Past Medical History:  Diagnosis Date  . Anxiety      Patient Active Problem List   Diagnosis Date Noted  . Dizziness 09/07/2017  . Anxiety 05/09/2017  . Depression, major, single episode, moderate ([place]) 05/09/2017    Past Surgical History:  Procedure Laterality Date  . 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 TONSILLECTOMY  1996    Family History        Family Status  Relation Name Status  . Mother  Alive  . Father  Alive  . Sister  Alive  . Brother  Alive  . MGF  (Not Specified)  . Neg Hx  (Not Specified)        Her family history includes Anxiety disorder in her brother, mother, and sister; Depression in her brother and mother; Healthy in her father; Lung cancer in her maternal grandfather.     Allergies  Allergen Reactions  . Oxycodone Itching and Swelling     Current Outpatient Medications:  .  citalopram (CELEXA) 20 MG tablet, Take 1 tablet (20 mg total) by mouth daily., Disp: 90 tablet, Rfl: 3 .  medroxyPROGESTERone (DEPO-PROVERA) 150 MG/ML injection, Inject 1 mL (150 mg total) into the muscle every 3 (three) months. Administered by [facility], Disp: 1 mL, Rfl: 3   Patient Care Team: Virginia Crews, MD as PCP - General (Family Medicine)      Objective:   Vitals: BP 98/70 (BP Location: Left Arm, Patient Position: Sitting, Cuff Size: Normal)   Pulse 71   Temp 98.2 F (36.8 C) (Oral)   Resp 16   Ht 5' 6"  (1.676 m)   Wt 150 lb 3.2 oz (68.1 kg)   SpO2 96%   BMI 24.24 kg/m    Vitals:   09/07/17 1519  BP: 98/70  Pulse: 71  Resp: 16  Temp: 98.2 F (36.8 C)  TempSrc: Oral  SpO2: 96%  Weight: 150 lb 3.2 oz (68.1 kg)  Height: 5' 6"  (1.676 m)     Physical Exam  Constitutional: She is oriented to person, place, and time. She appears well-developed and  well-nourished. No distress.  HENT:  Head: Normocephalic and atraumatic.  Right Ear: External ear normal.  Left Ear: External ear normal.  Nose: Nose normal.  Mouth/Throat: Oropharynx is clear and moist.  Eyes: Conjunctivae and EOM are normal. Pupils are equal, round, and reactive to light. No scleral icterus.  Neck: Neck supple. No thyromegaly present.  Cardiovascular: Normal rate, regular rhythm, normal heart sounds and intact distal pulses.  No murmur heard. Pulmonary/Chest: Effort normal and breath sounds normal. No respiratory distress. She has no wheezes. She has no rales.  Abdominal: Soft. Bowel sounds are normal. She  exhibits no distension. There is no tenderness. There is no rebound and no guarding.  Musculoskeletal: She exhibits no edema or deformity.  Lymphadenopathy:    She has no cervical adenopathy.  Neurological: She is alert and oriented to person, place, and time. No cranial nerve deficit. She exhibits normal muscle tone. Coordination normal.  Skin: Skin is warm and dry. No rash noted.  Psychiatric: She has a normal mood and affect. Her behavior is normal.  Vitals reviewed.    Depression Screen PHQ 2/9 Scores 09/07/2017 06/06/2017 05/09/2017 05/09/2017  PHQ - 2 Score 0 0 1 1  PHQ- 9 Score 7 4 16  -      Assessment & Plan:     Routine Health Maintenance and Physical Exam  Exercise Activities and Dietary recommendations Goals    None      Immunization History  Administered Date(s) Administered  . DTaP 10/26/1990, 10/18/1991, 12/21/1991, 07/08/1992, 03/23/1994  . Hepatitis B 06/02/2001, 07/07/2001  . HiB (PRP-OMP) 10/26/1990, 10/18/1991  . IPV 10/26/1990, 10/18/1991, 12/21/1991, 03/23/1994  . MMR 10/18/1991, 03/23/1994    Health Maintenance  Topic Date Due  . HIV Screening  03/22/2005  . TETANUS/TDAP  03/22/2009  . INFLUENZA VACCINE  11/20/2017 (Originally 03/23/2017)  . PAP SMEAR  08/08/2020     Discussed health benefits of physical activity, and  encouraged her to engage in regular exercise appropriate for her age and condition.  - Patient declines TDAP vaccination today - will bring records to next visit.    Problem List Items Addressed This Visit      Other   Anxiety    Fairly well controlled No further panic attacks Possible that episodes of dizziness, diaphoresis could be related to panic attacks, but patient declines feeling stressed or anxious when these occur Continue Celexa 26m daily F/u in 6 months      Depression, major, single episode, moderate ([facility])    Much improved Continue Celexa 20 mg daily F/u in 6 months      Dizziness    Episodic dizziness, diaphoresis, nausea, etc No correlation of symptoms to triggers No chest pain, palpitations, or SOB associated, so cardiac etiology is lower on differential  Most likely causes are hypotension due to dehydration or hypoglycemia after spikes from eating high carb meals No neuro symptoms to suggest serotonin syndrome and only taking Celexa - no other meds Advised keeping log of symptoms and checking BP and HR when episodes occur F/u in 6 wks Could consider cardiology referral for possible Holter       Other Visit Diagnoses    Encounter for annual physical exam    -  Primary      Return in about 6 weeks (around 10/19/2017) for dizziness f/u.  --------------------------------------------------------------------  The entirety of the information documented in the History of Present Illness, Review of Systems and Physical Exam were personally obtained by me. Portions of this information were initially documented by ERaquel SarnaRatchford, CMA and reviewed by me for thoroughness and accuracy.    BVirginia Crews MD, MPH [facility]1/16/2019 4:54 PM

## 2017-09-15 ENCOUNTER — Ambulatory Visit: Payer: 59 | Admitting: Psychiatry

## 2017-09-27 ENCOUNTER — Ambulatory Visit: Payer: 59 | Admitting: Licensed Clinical Social Worker

## 2017-09-27 DIAGNOSIS — F411 Generalized anxiety disorder: Secondary | ICD-10-CM | POA: Diagnosis not present

## 2017-10-19 ENCOUNTER — Ambulatory Visit: Payer: 59 | Admitting: Licensed Clinical Social Worker

## 2017-10-20 NOTE — Progress Notes (Signed)
  THERAPIST PROGRESS NOTE   Date of Service:   09/27/2017  Session Time:   1hour  Patient:   Stefanie Fitzgerald   DOB:   [birth year]  MR Number:  [id number]  Location:  [facility] [place] [facility] [place] [address] Dept: [phone]            Provider/Observer:  Lubertha South Counselor  Risk of Suicide/Violence: virtually non-existent   Diagnosis:    GAD (generalized anxiety disorder)  Type of Therapy: Individual Therapy  Treatment Goals addressed: Anxiety and Coping  Participation Level: Active  Interventions: CBT and Motivational Interviewing   Behavioral Response: CasualAlertEuthymic   Summary: Therapist met with Patient in an initial therapy session to assess current mood and to build rapport. Therapist engaged Patient in discussion about her life and what is going well for her. Therapist provided support for Patient as she shared details about her life, her current stressors, mood, coping skills, her past, and her children. Therapist prompted Patient to discuss her support system and ways that she manages her daily stress, anger, and frustrations.  LCSW discussed what psychotherapy is and is not and the importance of the therapeutic relationship to include open and honest communication between client and therapist and building trust.  Reviewed advantages and disadvantages of the therapeutic process and limitations to the therapeutic relationship including LCSW's role in maintaining the safety of the client, others and those in client's care. Therapist asked consumer what the term "imagery for remaining calm" means.  Therapist asked consumer if he feels he is good at calming down when upset.  Therapist reminded consumer that if she understands her trigger points then they can calm down quicker.  Therapist introduced an activity to assist with remaining  calm in given situations.  Therapist explained seeing things that upset you can make you feel tense.     Plan: Vertis Bauder Farrell-Buchanan will use coping skills to manage symptoms.   Return again in 2 weeks.

## 2017-10-21 ENCOUNTER — Encounter: Payer: Self-pay | Admitting: Family Medicine

## 2017-10-21 ENCOUNTER — Ambulatory Visit (INDEPENDENT_AMBULATORY_CARE_PROVIDER_SITE_OTHER): Payer: Managed Care, Other (non HMO) | Admitting: Family Medicine

## 2017-10-21 VITALS — BP 118/60 | HR 85 | Temp 97.9°F | Resp 16 | Wt 150.0 lb

## 2017-10-21 DIAGNOSIS — R42 Dizziness and giddiness: Secondary | ICD-10-CM | POA: Diagnosis not present

## 2017-10-21 DIAGNOSIS — F419 Anxiety disorder, unspecified: Secondary | ICD-10-CM | POA: Diagnosis not present

## 2017-10-21 NOTE — Assessment & Plan Note (Signed)
Fairly well controlled, no more panic attacks Has been somewhat exacerbated by recent life stressors Discussed options of increasing Celexa to higher dose, but mutual decision made to continue current dose F/u in 6 months

## 2017-10-21 NOTE — Assessment & Plan Note (Signed)
Now resolved May have been related to illness that started soon after or dehydration Return precautions discussed

## 2017-10-21 NOTE — Progress Notes (Signed)
Patient: Stefanie FlemingsSara Elizabeth Farrell-Buchanan Female    DOB: 10-08-1990   27 y.o.   MRN: [id number] Visit Date: 10/21/2017  Today's Provider: Shirlee LatchAngela Taheem Fricke, MD   I, Joslyn HyEmily Ratchford, CMA, am acting as scribe for Shirlee LatchAngela Etheridge Geil, MD.  Chief Complaint  Patient presents with  . Dizziness  . Anxiety   Subjective:    HPI     Follow up for Dizziness  The patient was last seen for this 6 weeks ago. Changes made at last visit include advising pt to keep log of symptoms, and checking BP and HR when episodes occur.  She states she has not had any more dizzy spells since LOV. She is has been sick twice sicne last being seen; once with a URI and once with the GI bug. She wonders if the dizziness was a sx of oncoming illness.  ------------------------------------------------------------------------------------ Anxiety Pt states this is well controlled, despite having triggering life events. She just learned that one of her co-workers from her previous job has passed away yesterday. She also had to put down her cat. She reports she was not taking her Celexa while she was sick, but has restarted the medication.   Has started seeing a therapist and is learning more coping skills.  Now is going to be going about once a month.   Allergies  Allergen Reactions  . Oxycodone Itching and Swelling     Current Outpatient Medications:  .  citalopram (CELEXA) 20 MG tablet, Take 1 tablet (20 mg total) by mouth daily., Disp: 90 tablet, Rfl: 3 .  medroxyPROGESTERone (DEPO-PROVERA) 150 MG/ML injection, Inject 1 mL (150 mg total) into the muscle every 3 (three) months. Administ[facility] by [facility], Disp: 1 mL, Rfl: 3  Review of Systems  Constitutional: Negative for activity change, appetite change, chills, diaphoresis, fatigue, fever and unexpected weight change.  Respiratory: Negative for shortness of breath.   Cardiovascular: Negative for chest pain, palpitations and leg swelling.    Neurological: Negative for dizziness and light-headedness.  Psychiatric/Behavioral: The patient is nervous/anxious.     Social History   Tobacco Use  . Smoking status: Never Smoker  . Smokeless tobacco: Never Used  Substance Use Topics  . Alcohol use: Yes    Comment: 1-2 glasses of wine per month   Objective:   BP 118/60 (BP Location: Left Arm, Patient Position: Sitting, Cuff Size: Normal)   Pulse 85   Temp 97.9 F (36.6 C) (Oral)   Resp 16   Wt 150 lb (68 kg)   SpO2 97%   BMI 24.21 kg/m  Vitals:   10/21/17 1616  BP: 118/60  Pulse: 85  Resp: 16  Temp: 97.9 F (36.6 C)  TempSrc: Oral  SpO2: 97%  Weight: 150 lb (68 kg)     Physical Exam  Constitutional: She is oriented to person, place, and time. She appears well-developed and well-nourished. No distress.  HENT:  Head: Normocephalic and atraumatic.  Eyes: Conjunctivae are normal.  Cardiovascular: Normal rate, regular rhythm, normal heart sounds and intact distal pulses.  No murmur heard. Pulmonary/Chest: Effort normal and breath sounds normal. No respiratory distress. She has no wheezes. She has no rales.  Musculoskeletal: She exhibits no edema.  Neurological: She is alert and oriented to person, place, and time.  Psychiatric: She has a normal mood and affect. Her behavior is normal.  Vitals reviewed.       Assessment & Plan:     Problem List Items Addressed This Visit  Other   Anxiety - Primary    Fairly well controlled, no more panic attacks Has been somewhat exacerbated by recent life stressors Discussed options of increasing Celexa to higher dose, but mutual decision made to continue current dose F/u in 6 months      Dizziness    Now resolved May have been related to illness that started soon after or dehydration Return precautions discussed          Return in about 6 months (around 04/23/2018) for anxiety f/u.   The entirety of the information documented in the History of Present  Illness, Review of Systems and Physical Exam were personally obtained by me. Portions of this information were initially documented by Irving Burton Ratchford, CMA and reviewed by me for thoroughness and accuracy.    Erasmo Downer, MD, MPH [facility] 10/21/2017 4:39 PM

## 2017-11-04 ENCOUNTER — Ambulatory Visit (INDEPENDENT_AMBULATORY_CARE_PROVIDER_SITE_OTHER): Payer: Managed Care, Other (non HMO)

## 2017-11-04 ENCOUNTER — Ambulatory Visit: Payer: Managed Care, Other (non HMO)

## 2017-11-04 DIAGNOSIS — Z3042 Encounter for surveillance of injectable contraceptive: Secondary | ICD-10-CM

## 2017-11-04 MED ORDER — MEDROXYPROGESTERONE ACETATE 150 MG/ML IM SUSP
150.0000 mg | Freq: Once | INTRAMUSCULAR | Status: AC
  Administered 2017-11-04: 150 mg via INTRAMUSCULAR

## 2017-11-16 ENCOUNTER — Ambulatory Visit: Payer: 59 | Admitting: Licensed Clinical Social Worker

## 2017-11-16 DIAGNOSIS — F411 Generalized anxiety disorder: Secondary | ICD-10-CM

## 2017-11-22 ENCOUNTER — Telehealth: Payer: Self-pay

## 2017-11-22 NOTE — Telephone Encounter (Signed)
Patient called to request we re file DOS 09/07/17. Patient reports her insurance is not recognizing office visit as Physical. CB# [id number].

## 2017-11-23 NOTE — Telephone Encounter (Signed)
Please review

## 2017-11-23 NOTE — Telephone Encounter (Signed)
I reviewed this OV.  It is coded correctly with a physical chief complaint, primary diagnosis and diagnosis code.  Can either of you tell what might be happening here?  Thanks!  Erasmo DownerBacigalupo, Brie Eppard M, MD, MPH [facility] 11/23/2017 9:35 AM

## 2017-11-29 NOTE — Telephone Encounter (Signed)
Patient's insurance has paid this visit as a CPE

## 2017-12-03 NOTE — Telephone Encounter (Signed)
FYI

## 2017-12-03 NOTE — Telephone Encounter (Signed)
Please let patient know that we have reviewed the visit and the charges.  It is all consistent with being billed as a physical and it appears her insurance has paid this as a physical.  Onis Markoff, Marzella SchleinAngela M, MD, MPH [facility] 12/03/2017 10:25 AM

## 2017-12-05 NOTE — Telephone Encounter (Signed)
Pt advised as below. She states her insurance will not pay her reimbursement fee for having her physical. She states she contact the company again, and will call back with more information on how to code appropriately if needed.

## 2017-12-09 NOTE — Progress Notes (Signed)
  THERAPIST PROGRESS NOTE   Date of Service:   11/16/2017  Session Time:   1 hour  Patient:   Stefanie Fitzgerald   DOB:   10-May-1991  MR Number:  [id number]  Location:  [facility] [place] [facility] [place] [address] Dept: [phone]            Provider/Observer:  Lubertha South Counselor  Risk of Suicide/Violence: virtually non-existent   Diagnosis:    GAD (generalized anxiety disorder)  Type of Therapy: Individual Therapy  Treatment Goals addressed: Anxiety  Participation Level: Active  Interventions: CBT and Motivational Interviewing   Behavioral Response: CasualAlertEuthymic   Summary: Therapist met with Patient in an outpatient setting to assess current mood and assist with making progress towards goals through the use of therapeutic intervention. Therapist did a brief mood check, assessing anger, fear, disgust, excitement, happiness, and sadness.  Patient reports her mood as being favorable.  She was able to discuss her stressors the past.  Patient reports being sick for over a week and having to depend on others for assistance with food and driving.  She reports that she continues to struggle with the relationship she has with her younger sister.  Explored with Patient her expectations of her sister.  Role played coping skills such as breathing techniques.    Plan: [name] will continue to manage symptoms by using coping skills.   Return again in 2 weeks.

## 2017-12-28 ENCOUNTER — Ambulatory Visit: Payer: 59 | Admitting: Licensed Clinical Social Worker

## 2017-12-28 DIAGNOSIS — F411 Generalized anxiety disorder: Secondary | ICD-10-CM | POA: Diagnosis not present

## 2018-01-04 ENCOUNTER — Encounter: Payer: Self-pay | Admitting: Family Medicine

## 2018-01-04 ENCOUNTER — Ambulatory Visit: Payer: Managed Care, Other (non HMO) | Admitting: Family Medicine

## 2018-01-04 VITALS — BP 96/60 | HR 80 | Temp 98.1°F | Resp 16 | Wt 150.0 lb

## 2018-01-04 DIAGNOSIS — L298 Other pruritus: Secondary | ICD-10-CM

## 2018-01-04 DIAGNOSIS — W57XXXA Bitten or stung by nonvenomous insect and other nonvenomous arthropods, initial encounter: Secondary | ICD-10-CM | POA: Diagnosis not present

## 2018-01-04 MED ORDER — PREDNISONE 10 MG (21) PO TBPK: ORAL_TABLET | ORAL | 0 refills | Status: DC

## 2018-01-04 NOTE — Progress Notes (Signed)
Patient: Stefanie Fitzgerald Female    DOB: 06/10/1991   27 y.o.   MRN: [id number] Visit Date: 01/04/2018  Today's Provider: Shirlee Latch, MD   I, Joslyn Hy, CMA, am acting as scribe for Shirlee Latch, MD.  Chief Complaint  Patient presents with  . Rash   Subjective:    Rash  This is a new problem. Episode onset: x 2 weeks. The problem is unchanged. The affected locations include the abdomen, torso, back, left arm, right arm, right upper leg and left upper leg. The rash is characterized by itchiness and redness. She was exposed to an insect bite/sting (bed bugs). Pertinent negatives include no anorexia, congestion, cough, diarrhea, eye pain, fatigue, fever, rhinorrhea, shortness of breath, sore throat or vomiting. Treatments tried: creams, antihistamine lotion, bug spray, oatmeal baths. The treatment provided no relief.       Allergies  Allergen Reactions  . Oxycodone Itching and Swelling     Current Outpatient Medications:  .  citalopram (CELEXA) 20 MG tablet, Take 1 tablet (20 mg total) by mouth daily., Disp: 90 tablet, Rfl: 3 .  medroxyPROGESTERone (DEPO-PROVERA) 150 MG/ML injection, Inject 1 mL (150 mg total) into the muscle every 3 (three) months. Administered by [facility], Disp: 1 mL, Rfl: 3  Review of Systems  Constitutional: Negative for fatigue and fever.  HENT: Negative for congestion, rhinorrhea and sore throat.   Eyes: Negative for pain.  Respiratory: Negative for cough and shortness of breath.   Gastrointestinal: Negative for anorexia, diarrhea and vomiting.  Skin: Positive for rash.    Social History   Tobacco Use  . Smoking status: Never Smoker  . Smokeless tobacco: Never Used  Substance Use Topics  . Alcohol use: Yes    Comment: 1-2 glasses of wine per month   Objective:   BP 96/60 (BP Location: Left Arm, Patient Position: Sitting, Cuff Size: Normal)   Pulse 80   Temp 98.1 F (36.7 C) (Oral)   Resp 16   Wt  150 lb (68 kg)   SpO2 97%   BMI 24.21 kg/m  Vitals:   01/04/18 1547  BP: 96/60  Pulse: 80  Resp: 16  Temp: 98.1 F (36.7 C)  TempSrc: Oral  SpO2: 97%  Weight: 150 lb (68 kg)     Physical Exam  Constitutional: She is oriented to person, place, and time. She appears well-developed and well-nourished.  HENT:  Head: Normocephalic and atraumatic.  Eyes: Conjunctivae are normal. No scleral icterus.  Cardiovascular: Normal rate and regular rhythm.  Pulmonary/Chest: Effort normal. No respiratory distress.  Neurological: She is alert and oriented to person, place, and time.  Skin: Skin is warm and dry. Capillary refill takes less than 2 seconds. Rash noted.  Psychiatric: She has a normal mood and affect. Her behavior is normal.  Vitals reviewed.          Assessment & Plan:   1. Pruritic erythematous rash 2. Bedbug bite, initial encounter - bed bug bites (classic, see picture above) - patient has already treated house/possessions for bed bugs - could use topical steroid, but rash covers arms, legs , torso - will treat instead with prednisone taper - discussed return precautions - including signs of infection    Meds ordered this encounter  Medications  . predniSONE (STERAPRED UNI-PAK 21 TAB) 10 MG (21) TBPK tablet    Sig: Take  PO x1d, then  x1d, then  x1d, then  x1d, then  x1d, then  x1d,  then stop    Dispense:  21 tablet    Refill:  0     Return if symptoms worsen or fail to improve.   The entirety of the information documented in the History of Present Illness, Review of Systems and Physical Exam were personally obtained by me. Portions of this information were initially documented by Irving Burton Ratchford, CMA and reviewed by me for thoroughness and accuracy.    Erasmo Downer, MD, MPH [facility] 01/04/2018 4:13 PM

## 2018-01-04 NOTE — Patient Instructions (Signed)
Bedbugs Bedbugs are tiny bugs that live in and around beds. They stay hidden during the day, and they come out at night and bite. Bedbugs need blood to live and grow. Where are bedbugs found? Bedbugs can be found anywhere, whether a place is clean or dirty. They are most often found in places where many people come and go, such as hotels, shelters, dorms, and health care settings. It is also common for them to be found in homes where there are many birds or bats nearby. What are bedbug bites like? A bedbug bite leaves a small red bump with a darker red dot in the middle. The bump may appear soon after a person is bitten or a day or more later. Bedbug bites usually do not hurt, but they may itch. Most people do not need treatment for bedbug bites. The bumps usually go away on their own in a few days. How do I check for bedbugs? Bedbugs are reddish-brown, oval, and flat. They range in size from 1 mm to 7 mm and they cannot fly. Look for bedbugs in these places:  On mattresses, bed frames, headboards, and box springs.  On drapes and curtains in bedrooms.  Under carpeting in bedrooms.  Behind electrical outlets.  Behind any wallpaper that is peeling.  Inside luggage.  Also look for black or red spots or stains on or near the bed. Stains can come from bedbugs that have been crushed or from bedbug waste. What should I do if I find bedbugs? When Traveling If you find bedbugs while traveling, check all of your possessions carefully before you bring them into your home. Consider throwing away anything that has bedbugs on it. At Home If you find bedbugs at home, your bedroom may need to be treated by a pest control expert. You may also need to throw away mattresses or luggage. To help keep bedbugs from coming back, consider taking these actions:  Put a plastic cover over your mattress.  Wash your clothes and bedding in water that is hotter than 120F (48.9C) and dry them on a hot setting.  Bedbugs are killed by high temperatures.  Vacuum often around the bed and in all of the cracks and crevices where the bugs might hide.  Carefully check all used furniture, bedding, or clothes that you bring into your home.  Eliminate bird nests and bat roosts that are near your home.  In Your Bed If you find bedbugs in your bed, consider wearing pajamas that have long sleeves and pant legs. Bedbugs usually bite areas of the skin that are not covered. This information is not intended to replace advice given to you by your health care provider. Make sure you discuss any questions you have with your health care provider. Document Released: 09/11/2010 Document Revised: 01/15/2016 Document Reviewed: 08/05/2014 Elsevier Interactive Patient Education  2018 Elsevier Inc.  

## 2018-01-19 ENCOUNTER — Encounter: Payer: Self-pay | Admitting: Family Medicine

## 2018-01-19 ENCOUNTER — Ambulatory Visit: Payer: Managed Care, Other (non HMO) | Admitting: Family Medicine

## 2018-01-19 VITALS — BP 100/60 | HR 90 | Temp 97.8°F | Resp 16

## 2018-01-19 DIAGNOSIS — W57XXXA Bitten or stung by nonvenomous insect and other nonvenomous arthropods, initial encounter: Secondary | ICD-10-CM | POA: Diagnosis not present

## 2018-01-19 DIAGNOSIS — L298 Other pruritus: Secondary | ICD-10-CM

## 2018-01-19 MED ORDER — PREDNISONE 10 MG (21) PO TBPK: ORAL_TABLET | ORAL | 0 refills | Status: DC

## 2018-01-19 NOTE — Progress Notes (Signed)
Patient: Stefanie Fitzgerald Female    DOB: [birth year]   27 y.o.   MRN: [id number] Visit Date: 01/19/2018  Today's Provider: Shirlee Latch, MD   I, Joslyn Hy, CMA, am acting as scribe for Shirlee Latch, MD.  Chief Complaint  Patient presents with  . Skin Problem   Subjective:    HPI     Follow up for Bedbug Bites  The patient was last seen for this 2 weeks ago. Changes made at last visit include adding Prednisone.  She reports good compliance with treatment. She feels that condition is Unchanged. She states she noticed improvement while on steroids, but since she completed the course, she has noticed the bites are not healing, and some bites are scarring.  She is not having side effects.  ------------------------------------------------------------------------------------     Allergies  Allergen Reactions  . Oxycodone Itching and Swelling     Current Outpatient Medications:  .  citalopram (CELEXA) 20 MG tablet, Take 1 tablet (20 mg total) by mouth daily., Disp: 90 tablet, Rfl: 3 .  medroxyPROGESTERone (DEPO-PROVERA) 150 MG/ML injection, Inject 1 mL (150 mg total) into the muscle every 3 (three) months. Administered by [facility], Disp: 1 mL, Rfl: 3  Review of Systems  Constitutional: Negative for activity change, appetite change, chills, diaphoresis, fatigue, fever and unexpected weight change.  Skin: Positive for rash.    Social History   Tobacco Use  . Smoking status: Never Smoker  . Smokeless tobacco: Never Used  Substance Use Topics  . Alcohol use: Yes    Comment: 1-2 glasses of wine per month   Objective:   BP 100/60 (BP Location: Left Arm, Patient Position: Sitting, Cuff Size: Normal)   Pulse 90   Temp 97.8 F (36.6 C) (Oral)   Resp 16   SpO2 99%  Vitals:   01/19/18 1451  BP: 100/60  Pulse: 90  Resp: 16  Temp: 97.8 F (36.6 C)  TempSrc: Oral  SpO2: 99%     Physical Exam  Constitutional: She is  oriented to person, place, and time. She appears well-developed and well-nourished. No distress.  HENT:  Head: Normocephalic and atraumatic.  Eyes: Conjunctivae are normal.  Cardiovascular: Normal rate and regular rhythm.  Pulmonary/Chest: Effort normal. No respiratory distress.  Musculoskeletal: She exhibits no edema.  Neurological: She is alert and oriented to person, place, and time.  Skin: Skin is warm and dry. Capillary refill takes less than 2 seconds. Rash noted.  Psychiatric: She has a normal mood and affect. Her behavior is normal.  Vitals reviewed.          Assessment & Plan:     1. Pruritic erythematous rash 2. Bedbug bite, initial encounter - somewhat improved but slow to improve - will repeat prednisone course - also offered options of watchful waiting and topical steroids - advised that it will take a while to fully heal - return precautions - signs of infection - discussed    Meds ordered this encounter  Medications  . predniSONE (STERAPRED UNI-PAK 21 TAB) 10 MG (21) TBPK tablet    Sig: Take  PO x1d, then  x1d, then  x1d, then  x1d, then  x1d, then  x1d, then stop    Dispense:  21 tablet    Refill:  0     Return if symptoms worsen or fail to improve.   The entirety of the information documented in the History of Present Illness, Review of Systems and Physical  Exam were personally obtained by me. Portions of this information were initially documented by Irving Burton Ratchford, CMA and reviewed by me for thoroughness and accuracy.    Erasmo Downer, MD, MPH [facility] 01/19/2018 3:17 PM

## 2018-01-20 NOTE — Progress Notes (Signed)
   THERAPIST PROGRESS NOTE  Session Time: 60min  Participation Level: Active  Behavioral Response: CasualAlertEuthymic  Type of Therapy: Individual Therapy  Treatment Goals addressed: Coping  Interventions: CBT and Motivational Interviewing  Summary: Stefanie Fitzgerald is a 27 y.o. female who presents with continued symptoms of her diagnosis.   Patient reports that she has been stressed lately dealing with her younger sister.  SHe reports that her mother has placed her in the middle of the drama that her sister and mother have been involved in .  She reports being supportive to both parties but wanting to remain neutral.  CBT interventions used to modifying behavioral pattern and understand thoughts guide his behavioral pattern.  CBT is displayed by the clinician seeking to learn what the client want out of life (their goals) and then help him achieve those goals.  Clinician actively listened, taught, and encouraged, while Patient's roles is to express concerns, learn, and implement that learning.  Suicidal/Homicidal: No   Plan: Return again in 2 weeks.  Diagnosis: Axis I: Generalized Anxiety Disorder    Axis II: No diagnosis    Marinda Elkicole M Leonidus Rowand, LCSW 12/28/17

## 2018-01-26 ENCOUNTER — Telehealth: Payer: Self-pay | Admitting: Obstetrics and Gynecology

## 2018-01-26 ENCOUNTER — Other Ambulatory Visit: Payer: Self-pay | Admitting: Obstetrics and Gynecology

## 2018-01-26 DIAGNOSIS — Z3042 Encounter for surveillance of injectable contraceptive: Secondary | ICD-10-CM

## 2018-01-26 MED ORDER — MEDROXYPROGESTERONE ACETATE 150 MG/ML IM SUSP: 150.0000 mg | INTRAMUSCULAR | 1 refills | Status: DC

## 2018-01-26 NOTE — Telephone Encounter (Signed)
Done

## 2018-01-26 NOTE — Telephone Encounter (Signed)
Patient is requesting refill on her  Depo. Patient is schedule 01/27/18 for Injection. Please advise

## 2018-01-27 ENCOUNTER — Ambulatory Visit (INDEPENDENT_AMBULATORY_CARE_PROVIDER_SITE_OTHER): Payer: Managed Care, Other (non HMO)

## 2018-01-27 DIAGNOSIS — Z3042 Encounter for surveillance of injectable contraceptive: Secondary | ICD-10-CM

## 2018-01-27 MED ORDER — MEDROXYPROGESTERONE ACETATE 150 MG/ML IM SUSP
150.0000 mg | Freq: Once | INTRAMUSCULAR | Status: AC
  Administered 2018-01-27: 150 mg via INTRAMUSCULAR

## 2018-02-14 ENCOUNTER — Ambulatory Visit: Payer: 59 | Admitting: Licensed Clinical Social Worker

## 2018-02-16 ENCOUNTER — Ambulatory Visit: Payer: 59 | Admitting: Licensed Clinical Social Worker

## 2018-03-28 ENCOUNTER — Ambulatory Visit: Payer: 59 | Admitting: Licensed Clinical Social Worker

## 2018-04-13 ENCOUNTER — Ambulatory Visit: Payer: 59 | Admitting: Licensed Clinical Social Worker

## 2018-04-13 DIAGNOSIS — F411 Generalized anxiety disorder: Secondary | ICD-10-CM | POA: Diagnosis not present

## 2018-04-14 ENCOUNTER — Ambulatory Visit (INDEPENDENT_AMBULATORY_CARE_PROVIDER_SITE_OTHER): Payer: Self-pay

## 2018-04-14 ENCOUNTER — Ambulatory Visit: Payer: Managed Care, Other (non HMO)

## 2018-04-14 DIAGNOSIS — Z3042 Encounter for surveillance of injectable contraceptive: Secondary | ICD-10-CM

## 2018-04-14 DIAGNOSIS — Z308 Encounter for other contraceptive management: Secondary | ICD-10-CM

## 2018-04-14 MED ORDER — MEDROXYPROGESTERONE ACETATE 150 MG/ML IM SUSP
150.0000 mg | Freq: Once | INTRAMUSCULAR | Status: AC
  Administered 2018-04-14: 150 mg via INTRAMUSCULAR

## 2018-04-14 NOTE — Progress Notes (Signed)
Pt here for depo which was given IM right glut.  NDC# [address]-1 

## 2018-04-28 ENCOUNTER — Ambulatory Visit: Payer: Managed Care, Other (non HMO) | Admitting: Family Medicine

## 2018-04-28 ENCOUNTER — Encounter: Payer: Self-pay | Admitting: Family Medicine

## 2018-04-28 VITALS — BP 100/60 | HR 90 | Temp 98.2°F | Wt 156.6 lb

## 2018-04-28 DIAGNOSIS — F419 Anxiety disorder, unspecified: Secondary | ICD-10-CM | POA: Diagnosis not present

## 2018-04-28 DIAGNOSIS — F325 Major depressive disorder, single episode, in full remission: Secondary | ICD-10-CM | POA: Diagnosis not present

## 2018-04-28 NOTE — Assessment & Plan Note (Signed)
Much improved Now in full remission Has already stopped Celexa and remains stable Continue therapy as needed Return precautions discussed

## 2018-04-28 NOTE — Assessment & Plan Note (Signed)
Much improved Now well controlled with only therapy as needed Has already stopped Celexa and remains stable Follow-up as needed Return precautions discussed

## 2018-04-28 NOTE — Progress Notes (Addendum)
Patient: Stefanie Fitzgerald Female    DOB: 1991/03/25   27 y.o.   MRN: [id number] Visit Date: 04/28/2018  Today's Provider: Shirlee Latch, MD   Chief Complaint  Patient presents with  . Anxiety   Subjective:    I, Presley Raddle, CMA, am acting as a scribe for Shirlee Latch, MD.   HPI Anxiety: Patient presents for a 6 month follow up. Last OV was on 10/21/2017. Patient advised to continue current dose of Celexa and follow up in 6 months. She reports good compliance with treatment plan until approximately 1.5 months ago. She states she stopped taking the medication 1.5 months ago and symptoms are stable. Patient states she thinks she does not need the medication any longer.   She continue to see her therapist, but only as needed.  She is doing better after discussing herself from family drama that was causing her lots of anxiety in the past.  Depression screen [facility] 2/9 04/28/2018 09/07/2017 06/06/2017 05/09/2017 05/09/2017  Decreased Interest 1 0 0 1 1  Down, Depressed, Hopeless 0 0 0 0 0  PHQ - 2 Score 1 0 0 1 1  Altered sleeping 1 3 1 3  -  Tired, decreased energy 0 2 0 3 3  Change in appetite 0 0 1 2 3   Feeling bad or failure about yourself  0 0 1 2 2   Trouble concentrating 1 0 0 2 2  Moving slowly or fidgety/restless 1 2 1 3 2   Suicidal thoughts 0 0 0 0 0  PHQ-9 Score 4 7 4 16  -  Difficult doing work/chores Not difficult at all Somewhat difficult - Somewhat difficult Somewhat difficult    GAD 7 : Generalized Anxiety Score 04/28/2018 09/07/2017 06/06/2017 05/09/2017  Nervous, Anxious, on Edge 0 1 0 3  Control/stop worrying 0 0 0 3  Worry too much - different things 0 1 0 3  Trouble relaxing 0 0 0 2  Restless 0 0 1 3  Easily annoyed or irritable 0 1 0 3  Afraid - awful might happen 0 0 0 2  Total GAD 7 Score 0 3 1 19   Anxiety Difficulty Not difficult at all Not difficult at all Not difficult at all Very difficult     Allergies  Allergen Reactions  .  Oxycodone Itching and Swelling     Current Outpatient Medications:  .  medroxyPROGESTERone (DEPO-PROVERA) 150 MG/ML injection, Inject 1 mL (150 mg total) into the muscle every 3 (three) months. Administered by [facility], Disp: 1 mL, Rfl: 1  Review of Systems  Constitutional: Negative.   HENT: Negative.   Respiratory: Negative.   Cardiovascular: Negative.   Genitourinary: Negative.   Musculoskeletal: Negative.   Skin: Negative.   Neurological: Negative.   Psychiatric/Behavioral: Negative for agitation, decreased concentration, dysphoric mood, self-injury, sleep disturbance and suicidal ideas. The patient is nervous/anxious.     Social History   Tobacco Use  . Smoking status: Never Smoker  . Smokeless tobacco: Never Used  Substance Use Topics  . Alcohol use: Yes    Comment: 1-2 glasses of wine per month   Objective:   BP 100/60 (BP Location: Left Arm, Patient Position: Sitting, Cuff Size: Normal)   Pulse 90   Temp 98.2 F (36.8 C) (Oral)   Wt 156 lb 9.6 oz (71 kg)   SpO2 98%   BMI 25.28 kg/m  Vitals:   04/28/18 1606  BP: 100/60  Pulse: 90  Temp: 98.2 F (36.8  C)  TempSrc: Oral  SpO2: 98%  Weight: 156 lb 9.6 oz (71 kg)     Physical Exam  Constitutional: She is oriented to person, place, and time. She appears well-developed and well-nourished. No distress.  HENT:  Head: Normocephalic and atraumatic.  Cardiovascular: Normal rate, regular rhythm, normal heart sounds and intact distal pulses.  No murmur heard. Pulmonary/Chest: Effort normal and breath sounds normal. No respiratory distress. She has no wheezes. She has no rales.  Musculoskeletal: She exhibits no edema.  Neurological: She is alert and oriented to person, place, and time.  Skin: Skin is warm and dry. Capillary refill takes less than 2 seconds. No rash noted.  Psychiatric: She has a normal mood and affect. Her behavior is normal.  Vitals reviewed.       Assessment & Plan:   Problem List  Items Addressed This Visit      Other   Anxiety - Primary    Much improved Now well controlled with only therapy as needed Has already stopped Celexa and remains stable Follow-up as needed Return precautions discussed      Depression, major, single episode, moderate (HCC)    Much improved Now in full remission Has already stopped Celexa and remains stable Continue therapy as needed Return precautions discussed          Return in about 4 months (around 08/28/2018) for CPE after 1/16.   The entirety of the information documented in the History of Present Illness, Review of Systems and Physical Exam were personally obtained by me. Portions of this information were initially documented by Presley Raddle, CMA and reviewed by me for thoroughness and accuracy.    Erasmo Downer, MD, MPH [facility] 04/28/2018 4:35 PM

## 2018-05-29 NOTE — Progress Notes (Signed)
   THERAPIST PROGRESS NOTE  Session Time:  Participation Level: Active  Behavioral Response: CasualAlertEuthymic  Type of Therapy: Individual Therapy  Treatment Goals addressed: Anxiety  Interventions: CBT and Motivational Interviewing  Summary: Stefanie Fitzgerald is a 27 y.o. female who presents with a reduction in symptoms.  Patient reports " good" mood.   Therapist introduced Patient to Practical Facts About Mental Illnesses from and thoroughly presented Patient with information regarding her diagnosis.  Therapist then assisted Patient with understanding coping skills, what they are and how they can help.  Therapist complied a list with Patient and allowed her to choose. Therapist defined each coping skill and modeled how to utilize.  Therapist gave Patient homework of practicing the coping skills to reduce symptoms over the next week.   Suicidal/Homicidal: No  Plan: Return again in 2 weeks.  Diagnosis: Axis I: Generalized Anxiety Disorder    Axis II: No diagnosis    Marinda Elk, LCSW 04/13/2018

## 2018-07-05 ENCOUNTER — Other Ambulatory Visit: Payer: Self-pay

## 2018-07-05 ENCOUNTER — Other Ambulatory Visit: Payer: Self-pay | Admitting: Obstetrics and Gynecology

## 2018-07-05 DIAGNOSIS — Z3042 Encounter for surveillance of injectable contraceptive: Secondary | ICD-10-CM

## 2018-07-05 MED ORDER — MEDROXYPROGESTERONE ACETATE 150 MG/ML IM SUSP: 150.0000 mg | INTRAMUSCULAR | 0 refills | Status: DC

## 2018-07-07 ENCOUNTER — Ambulatory Visit (INDEPENDENT_AMBULATORY_CARE_PROVIDER_SITE_OTHER): Payer: Managed Care, Other (non HMO)

## 2018-07-07 DIAGNOSIS — Z3042 Encounter for surveillance of injectable contraceptive: Secondary | ICD-10-CM

## 2018-07-07 MED ORDER — MEDROXYPROGESTERONE ACETATE 150 MG/ML IM SUSP
150.0000 mg | Freq: Once | INTRAMUSCULAR | Status: AC
  Administered 2018-07-07: 150 mg via INTRAMUSCULAR

## 2018-07-07 NOTE — Progress Notes (Signed)
Pt here for depo which was given IM right glut.  NDC# [address]-1 

## 2018-08-09 ENCOUNTER — Encounter: Payer: Self-pay | Admitting: Obstetrics and Gynecology

## 2018-08-09 ENCOUNTER — Ambulatory Visit (INDEPENDENT_AMBULATORY_CARE_PROVIDER_SITE_OTHER): Payer: Managed Care, Other (non HMO) | Admitting: Obstetrics and Gynecology

## 2018-08-09 VITALS — BP 102/56 | HR 93 | Ht 66.0 in | Wt 162.0 lb

## 2018-08-09 DIAGNOSIS — Z01419 Encounter for gynecological examination (general) (routine) without abnormal findings: Secondary | ICD-10-CM

## 2018-08-09 DIAGNOSIS — L309 Dermatitis, unspecified: Secondary | ICD-10-CM

## 2018-08-09 DIAGNOSIS — Z3042 Encounter for surveillance of injectable contraceptive: Secondary | ICD-10-CM

## 2018-08-09 DIAGNOSIS — Z124 Encounter for screening for malignant neoplasm of cervix: Secondary | ICD-10-CM

## 2018-08-09 MED ORDER — CLOTRIMAZOLE-BETAMETHASONE 1-0.05 % EX CREA: TOPICAL_CREAM | CUTANEOUS | 0 refills | Status: DC

## 2018-08-09 MED ORDER — MEDROXYPROGESTERONE ACETATE 150 MG/ML IM SUSP: 150.0000 mg | INTRAMUSCULAR | 3 refills | Status: DC

## 2018-08-09 NOTE — Patient Instructions (Signed)
I value your feedback and entrusting us with your care. If you get a [facility] patient survey, I would appreciate you taking the time to let us know about your experience today. Thank you! 

## 2018-08-09 NOTE — Progress Notes (Signed)
PCP:  Erasmo Downer, MD   Chief Complaint  Patient presents with  . Gynecologic Exam    has patches of dry skin on legs, back and shoulders x 1 month  . [employer] Employee     HPI:      [name] is a 27 y.o. G0P0000 who LMP was No LMP recorded. Patient has had an injection., presents today for her annual examination.  Her menses are absent due to depo.  Dysmenorrhea none. She does not have intermenstrual bleeding. On depo for severe dysmen.   Sex activity: not sexually active.  Last Pap: June 29, 2016  Results were: no abnormalities  Hx of STDs: chlamydia  There is no FH of breast cancer. There is no FH of ovarian cancer. The patient does do self-breast exams.  Tobacco use: The patient denies current or previous tobacco use. Alcohol use: social drinker No drug use.  Exercise: not active  She does get adequate calcium and Vitamin D in her diet. Pt notes dry itchy patches that are spreading, sx for the past month. Started on RT hip and now has places on back and LT shoulder, as well as scalp. Pt now using dryer sheets which is different. Has been using cream without relief. Just got Aquaphor. No hx of eczema in past.   Past Medical History:  Diagnosis Date  . Anxiety   . Stress 2018   and panic    Past Surgical History:  Procedure Laterality Date  . 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 TONSILLECTOMY  1996  . WISDOM TOOTH EXTRACTION      Family History  Problem Relation Age of Onset  . Depression Mother   . Anxiety disorder Mother   . Healthy Father   . Anxiety disorder Sister   . Depression Brother   . Anxiety disorder Brother   . Lung cancer Maternal Grandfather        smoker  . Breast cancer Neg Hx   . Colon cancer Neg Hx     Social History   Socioeconomic History  . Marital status: Single    Spouse name: Not on file  . Number of children: 0  . Years of education: bachelor's  . Highest education level:  Bachelor's degree (e.g., BA, AB, BS)  Occupational History    Employer: [employer]    Comment: FULL TIME  Social Needs  . Financial resource strain: Somewhat hard  . Food insecurity:    Worry: Never true    Inability: Never true  . Transportation needs:    Medical: No    Non-medical: No  Tobacco Use  . Smoking status: Never Smoker  . Smokeless tobacco: Never Used  Substance and Sexual Activity  . Alcohol use: Yes    Comment: 1-2 glasses of wine per month  . Drug use: No  . Sexual activity: Not Currently    Birth control/protection: Injection  Lifestyle  . Physical activity:    Days per week: 4 days    Minutes per session: 60 min  . Stress: Only a little  Relationships  . Social connections:    Talks on phone: More than three times a week    Gets together: More than three times a week    Attends religious service: More than 4 times per year    Active member of club or organization: Yes    Attends meetings of clubs or organizations: More than 4 times per year  Relationship status: Never married  . Intimate partner violence:    Fear of current or ex partner: No    Emotionally abused: No    Physically abused: No    Forced sexual activity: No  Other Topics Concern  . Not on file  Social History Narrative  . Not on file    Current Meds  Medication Sig  . medroxyPROGESTERone (DEPO-PROVERA) 150 MG/ML injection Inject 1 mL (150 mg total) into the muscle every 3 (three) months. Administ[facility] by [facility]  . [DISCONTINUED] medroxyPROGESTERone (DEPO-PROVERA) 150 MG/ML injection Inject 1 mL (150 mg total) into the muscle every 3 (three) months. [facility] by [facility]     ROS:  Review of Systems  Constitutional: Negative for fatigue, fever and unexpected weight change.  Respiratory: Negative for cough, shortness of breath and wheezing.   Cardiovascular: Negative for chest pain, palpitations and leg swelling.  Gastrointestinal: Negative for blood in stool,  constipation, diarrhea, nausea and vomiting.  Endocrine: Negative for cold intolerance, heat intolerance and polyuria.  Genitourinary: Negative for dyspareunia, dysuria, flank pain, frequency, genital sores, hematuria, menstrual problem, pelvic pain, urgency, vaginal bleeding, vaginal discharge and vaginal pain.  Musculoskeletal: Negative for back pain, joint swelling and myalgias.  Skin: Positive for rash.  Neurological: Negative for dizziness, syncope, light-headedness, numbness and headaches.  Hematological: Negative for adenopathy.  Psychiatric/Behavioral: Negative for agitation, confusion, sleep disturbance and suicidal ideas. The patient is not nervous/anxious.      Objective: BP (!) 102/56   Pulse 93   Ht 5\' 6"  (1.676 m)   Wt 162 lb (73.5 kg)   BMI 26.15 kg/m    Physical Exam Constitutional:      Appearance: She is well-developed.  Genitourinary:     Vulva, vagina, uterus, right adnexa and left adnexa normal.     No vulval lesion or tenderness noted.     No vaginal discharge, erythema or tenderness.     No cervical motion tenderness or polyp.     Uterus is not enlarged or tender.     No right or left adnexal mass present.     Right adnexa not tender.     Left adnexa not tender.  Neck:     Musculoskeletal: Normal range of motion.     Thyroid: No thyromegaly.  Cardiovascular:     Rate and Rhythm: Normal rate and regular rhythm.     Heart sounds: Normal heart sounds. No murmur.  Pulmonary:     Effort: Pulmonary effort is normal.     Breath sounds: Normal breath sounds.  Chest:     Breasts:        Right: No mass, nipple discharge, skin change or tenderness.        Left: No mass, nipple discharge, skin change or tenderness.  Abdominal:     Palpations: Abdomen is soft.     Tenderness: There is no abdominal tenderness. There is no guarding.  Musculoskeletal: Normal range of motion.  Neurological:     Mental Status: She is alert and oriented to person, place, and  time.     Cranial Nerves: No cranial nerve deficit.  Skin:    Findings: Rash present. Rash is scaling.       Psychiatric:        Behavior: Behavior normal.  Vitals signs reviewed.     Assessment/Plan: Encounter for annual routine gynecological examination  Cervical cancer screening - Plan: IGP, rfx Aptima HPV ASCU  Encounter for surveillance of injectable contraceptive -  Depo RF. Cont calcium.  - Plan: medroxyPROGESTERone (DEPO-PROVERA) 150 MG/ML injection  Dermatitis - Question fungal vs eczema, but given patches in scalp, most likely eczema. Try Rx lotrisone crm. Sched derm appt. Cool showers, no dryer sheets.  Meds ordered this encounter  Medications  . medroxyPROGESTERone (DEPO-PROVERA) 150 MG/ML injection    Sig: Inject 1 mL (150 mg total) into the muscle every 3 (three) months. Administered by [facility]    Dispense:  1 mL    Refill:  3    Order Specific Question:   Supervising Provider    Answer:   Nadara Mustard [[id number]]             GYN counsel adequate intake of calcium and vitamin D     F/U  Return in about 1 year (around 08/10/2019).  Lorijean Husser B. Yosselin Zoeller, PA-C 08/09/2018 4:44 PM

## 2018-08-15 LAB — IGP, RFX APTIMA HPV ASCU

## 2018-08-25 ENCOUNTER — Ambulatory Visit: Payer: Self-pay | Admitting: Family Medicine

## 2018-09-08 ENCOUNTER — Encounter: Payer: Self-pay | Admitting: Family Medicine

## 2018-09-29 ENCOUNTER — Ambulatory Visit: Payer: Managed Care, Other (non HMO)

## 2018-09-29 ENCOUNTER — Ambulatory Visit (INDEPENDENT_AMBULATORY_CARE_PROVIDER_SITE_OTHER): Payer: 59

## 2018-09-29 DIAGNOSIS — Z3042 Encounter for surveillance of injectable contraceptive: Secondary | ICD-10-CM

## 2018-09-29 MED ORDER — MEDROXYPROGESTERONE ACETATE 150 MG/ML IM SUSP
150.0000 mg | Freq: Once | INTRAMUSCULAR | Status: AC
  Administered 2018-09-29: 150 mg via INTRAMUSCULAR

## 2018-12-07 ENCOUNTER — Encounter: Payer: Self-pay | Admitting: Family Medicine

## 2018-12-15 ENCOUNTER — Telehealth: Payer: Self-pay | Admitting: Family Medicine

## 2018-12-15 ENCOUNTER — Telehealth: Payer: Self-pay

## 2018-12-15 NOTE — Telephone Encounter (Signed)
Patient called stating that she is having stress related to her job with increased heart palpitations between 125-180 bpm. Doxy.me visit scheduled for 12/18/2018 @ 9:40 AM. Advised patient to got to ER if symptoms continue.

## 2018-12-18 ENCOUNTER — Encounter: Payer: Self-pay | Admitting: Family Medicine

## 2018-12-18 ENCOUNTER — Ambulatory Visit (INDEPENDENT_AMBULATORY_CARE_PROVIDER_SITE_OTHER): Payer: 59 | Admitting: Family Medicine

## 2018-12-18 DIAGNOSIS — F321 Major depressive disorder, single episode, moderate: Secondary | ICD-10-CM | POA: Diagnosis not present

## 2018-12-18 DIAGNOSIS — R002 Palpitations: Secondary | ICD-10-CM | POA: Diagnosis not present

## 2018-12-18 DIAGNOSIS — F411 Generalized anxiety disorder: Secondary | ICD-10-CM

## 2018-12-18 MED ORDER — CITALOPRAM HYDROBROMIDE 20 MG PO TABS: 20.0000 mg | ORAL_TABLET | Freq: Every day | ORAL | 3 refills | Status: DC

## 2018-12-18 NOTE — Patient Instructions (Signed)
Palpitations  Palpitations are feelings that your heartbeat is irregular or is faster than normal. It may feel like your heart is fluttering or skipping a beat. Palpitations are usually not a serious problem. They may be caused by many things, including smoking, caffeine, alcohol, stress, and certain medicines or drugs. Most causes of palpitations are not serious. However, some palpitations can be a sign of a serious problem. You may need further tests to rule out serious medical problems.  Follow these instructions at home:         Pay attention to any changes in your condition. Take these actions to help manage your symptoms:  Eating and drinking  Avoid foods and drinks that may cause palpitations. These may include:  Caffeinated coffee, tea, soft drinks, diet pills, and energy drinks.  Chocolate.  Alcohol.  Lifestyle  Take steps to reduce your stress and anxiety. Things that can help you relax include:  Yoga.  Mind-body activities, such as deep breathing, meditation, or using words and images to create positive thoughts (guided imagery).  Physical activity, such as swimming, jogging, or walking. Tell your health care provider if your palpitations increase with activity. If you have chest pain or shortness of breath with activity, do not continue the activity until you are seen by your health care provider.  Biofeedback. This is a method that helps you learn to use your mind to control things in your body, such as your heartbeat.  Do not use drugs, including cocaine or ecstasy. Do not use marijuana.  Get plenty of rest and sleep. Keep a regular bed time.  General instructions  Take over-the-counter and prescription medicines only as told by your health care provider.  Do not use any products that contain nicotine or tobacco, such as cigarettes and e-cigarettes. If you need help quitting, ask your health care provider.  Keep all follow-up visits as told by your health care provider. This is important. These may  include visits for further testing if palpitations do not go away or get worse.  Contact a health care provider if you:  Continue to have a fast or irregular heartbeat after 24 hours.  Notice that your palpitations occur more often.  Get help right away if you:  Have chest pain or shortness of breath.  Have a severe headache.  Feel dizzy or you faint.  Summary  Palpitations are feelings that your heartbeat is irregular or is faster than normal. It may feel like your heart is fluttering or skipping a beat.  Palpitations may be caused by many things, including smoking, caffeine, alcohol, stress, certain medicines, and drugs.  Although most causes of palpitations are not serious, some causes can be a sign of a serious medical problem.  Get help right away if you faint or have chest pain, shortness of breath, a severe headache, or dizziness.  This information is not intended to replace advice given to you by your health care provider. Make sure you discuss any questions you have with your health care provider.  Document Released: 08/06/2000 Document Revised: 09/21/2017 Document Reviewed: 09/21/2017  Elsevier Interactive Patient Education  2019 [employer]vier Inc[employer]

## 2018-12-18 NOTE — Progress Notes (Signed)
Patient: Stefanie Fitzgerald Female    DOB: December 01, 1990   28 y.o.   MRN: [id number] Visit Date: 12/18/2018  Today's Provider: Shirlee Latch, MD   Chief Complaint  Patient presents with  . Palpitations   Subjective:    Virtual Visit via Video Note  I connected with Silke Drayer Farrell-Buchanan on 12/18/18 at  9:40 AM EDT by a video enabled telemedicine application and verified that I am speaking with the correct person using two identifiers.   I discussed the limitations of evaluation and management by telemedicine and the availability of in person appointments. The patient expressed understanding and agreed to proceed.   Patient location: home Provider location: home office  Persons involved in the visit: patient, provider    Palpitations   This is a new problem. Episode onset: 1 week ago. Episode frequency: episodes  The problem has been unchanged. The symptoms are aggravated by stress and caffeine. Associated symptoms include anxiety and an irregular heartbeat. Associated symptoms comments: Stress at work . She has tried nothing for the symptoms. Risk factors include stress and family history. Her past medical history is significant for anxiety.   Anxiety was previously well controlled and she was able to come off of Celexa.  It has been worsening in the setting of stress at work, needing to work from her parents house, global pandemic.  She has been having more nightmares, difficulty sleeping, and episodes of panic symptoms that last 5 to 20 minutes intermittently.  These are mostly palpitations.  She states she had panic attacks previously and her symptom was hyperventilation.  She feels like these are related to her anxiety as they tend to relieve when she calms herself down.   GAD 7 : Generalized Anxiety Score 12/18/2018 04/28/2018 09/07/2017 06/06/2017  Nervous, Anxious, on Edge 2 0 1 0  Control/stop worrying 2 0 0 0  Worry too much - different things 2 0  1 0  Trouble relaxing 3 0 0 0  Restless 3 0 0 1  Easily annoyed or irritable 3 0 1 0  Afraid - awful might happen 1 0 0 0  Total GAD 7 Score 16 0 3 1  Anxiety Difficulty Somewhat difficult Not difficult at all Not difficult at all Not difficult at all    Depression screen [facility] 2/9 12/18/2018 04/28/2018 09/07/2017 06/06/2017 05/09/2017  Decreased Interest 1 1 0 0 1  Down, Depressed, Hopeless 1 0 0 0 0  PHQ - 2 Score 2 1 0 0 1  Altered sleeping 3 1 3 1 3   Tired, decreased energy 3 0 2 0 3  Change in appetite 2 0 0 1 2  Feeling bad or failure about yourself  1 0 0 1 2  Trouble concentrating 2 1 0 0 2  Moving slowly or fidgety/restless 3 1 2 1 3   Suicidal thoughts 0 0 0 0 0  PHQ-9 Score 16 4 7 4 16   Difficult doing work/chores Very difficult Not difficult at all Somewhat difficult - Somewhat difficult     Allergies  Allergen Reactions  . Oxycodone Itching and Swelling     Current Outpatient Medications:  .  medroxyPROGESTERone (DEPO-PROVERA) 150 MG/ML injection, Inject 1 mL (150 mg total) into the muscle every 3 (three) months. Administered by [facility], Disp: 1 mL, Rfl: 3  Review of Systems  Constitutional: Negative.   Respiratory: Negative.   Cardiovascular: Positive for palpitations.  Musculoskeletal: Negative.   Psychiatric/Behavioral: The patient is  nervous/anxious.     Social History   Tobacco Use  . Smoking status: Never Smoker  . Smokeless tobacco: Never Used  Substance Use Topics  . Alcohol use: Yes    Comment: 1-2 glasses of wine per month      Objective:   There were no vitals taken for this visit. There were no vitals filed for this visit.   Physical Exam Constitutional:      Appearance: Normal appearance.  Pulmonary:     Effort: Pulmonary effort is normal. No respiratory distress.  Neurological:     Mental Status: She is alert and oriented to person, place, and time. Mental status is at baseline.  Psychiatric:        Mood and Affect: Mood is  anxious. Affect is labile.        Speech: Speech normal.        Behavior: Behavior normal.        Thought Content: Thought content does not include homicidal or suicidal ideation.         Assessment & Plan    I discussed the assessment and treatment plan with the patient. The patient was provided an opportunity to ask questions and all were answered. The patient agreed with the plan and demonstrated an understanding of the instructions.   The patient was advised to call back or seek an in-person evaluation if the symptoms worsen or if the condition fails to improve as anticipated.  Problem List Items Addressed This Visit      Other   GAD (generalized anxiety disorder) - Primary    Uncontrolled Worsening in setting of pandemic and stress at work Will resume celexa at 20mg  daily Discussed importance of therapy Suspect palpitations are a panic symptom F/u in 1 month      Relevant Medications   citalopram (CELEXA) 20 MG tablet   Other Relevant Orders   Holter monitor - 48 hour   Depression, major, single episode, moderate (HCC)    Uncontrolled Related to current pandemic and stress at work Will resume celexa at 20mg  daily Discussed importance of therapy Contracted for safety - no SI/HI Return precautions discussed      Relevant Medications   citalopram (CELEXA) 20 MG tablet   Palpitations    New problem Seem to be related to stress and panic symptoms due to uncontrolled anxiety Cannot rule outother etiologies, so will get Holter monitor May check labs at next visit as well if not resolving with SSRI as above      Relevant Orders   Holter monitor - 48 hour       Return in about 4 weeks (around 01/15/2019) for GAD f/u.   The entirety of the information documented in the History of Present Illness, Review of Systems and Physical Exam were personally obtained by me. Portions of this information were initially documented by Presley RaddleNikki Walston, CMA and reviewed by me for  thoroughness and accuracy.    Erasmo DownerBacigalupo, Shanan Mcmiller M, MD, MPH [facility] 12/20/2018 12:51 PM

## 2018-12-20 NOTE — Assessment & Plan Note (Signed)
New problem Seem to be related to stress and panic symptoms due to uncontrolled anxiety Cannot rule outother etiologies, so will get Holter monitor May check labs at next visit as well if not resolving with SSRI as above

## 2018-12-20 NOTE — Assessment & Plan Note (Signed)
Uncontrolled Related to current pandemic and stress at work Will resume celexa at 20mg  daily Discussed importance of therapy Contracted for safety - no SI/HI Return precautions discussed

## 2018-12-20 NOTE — Assessment & Plan Note (Signed)
Uncontrolled Worsening in setting of pandemic and stress at work Will resume celexa at 20mg  daily Discussed importance of therapy Suspect palpitations are a panic symptom F/u in 1 month

## 2018-12-22 ENCOUNTER — Other Ambulatory Visit: Payer: Self-pay

## 2018-12-22 ENCOUNTER — Ambulatory Visit (INDEPENDENT_AMBULATORY_CARE_PROVIDER_SITE_OTHER): Payer: 59

## 2018-12-22 DIAGNOSIS — Z3042 Encounter for surveillance of injectable contraceptive: Secondary | ICD-10-CM | POA: Diagnosis not present

## 2018-12-22 MED ORDER — MEDROXYPROGESTERONE ACETATE 150 MG/ML IM SUSP
150.0000 mg | Freq: Once | INTRAMUSCULAR | Status: AC
  Administered 2018-12-22: 150 mg via INTRAMUSCULAR

## 2018-12-24 ENCOUNTER — Encounter: Payer: Self-pay | Admitting: Family Medicine

## 2019-01-03 ENCOUNTER — Other Ambulatory Visit: Payer: Self-pay

## 2019-01-03 ENCOUNTER — Ambulatory Visit
Admission: RE | Admit: 2019-01-03 | Discharge: 2019-01-03 | Disposition: A | Discharge status: Home / Self Care | Payer: 59 | Source: Ambulatory Visit | Attending: Family Medicine | Admitting: Family Medicine

## 2019-01-03 DIAGNOSIS — R002 Palpitations: Secondary | ICD-10-CM | POA: Diagnosis not present

## 2019-01-03 DIAGNOSIS — F411 Generalized anxiety disorder: Secondary | ICD-10-CM | POA: Insufficient documentation

## 2019-01-08 ENCOUNTER — Ambulatory Visit
Admission: RE | Admit: 2019-01-08 | Discharge: 2019-01-08 | Disposition: A | Discharge status: Home / Self Care | Payer: 59 | Source: Ambulatory Visit | Attending: Family Medicine | Admitting: Family Medicine

## 2019-01-09 ENCOUNTER — Other Ambulatory Visit: Payer: Self-pay | Admitting: Family Medicine

## 2019-01-09 NOTE — Telephone Encounter (Signed)
Please review

## 2019-01-17 ENCOUNTER — Telehealth: Payer: Self-pay

## 2019-01-17 NOTE — Telephone Encounter (Signed)
Patient was advise. 

## 2019-01-17 NOTE — Telephone Encounter (Signed)
-----   Message from Erasmo Downer, MD sent at 01/17/2019 10:25 AM EDT ----- Small runs of sinus tachycardia (fast heart rate) without arrhythmia.  Likely related to anxiety.  No changes needed at this time.  Can add a beta blocker to lower heart rate if persists

## 2019-01-18 ENCOUNTER — Encounter: Payer: Self-pay | Admitting: Family Medicine

## 2019-01-18 ENCOUNTER — Ambulatory Visit (INDEPENDENT_AMBULATORY_CARE_PROVIDER_SITE_OTHER): Payer: 59 | Admitting: Family Medicine

## 2019-01-18 DIAGNOSIS — R002 Palpitations: Secondary | ICD-10-CM | POA: Diagnosis not present

## 2019-01-18 DIAGNOSIS — F411 Generalized anxiety disorder: Secondary | ICD-10-CM

## 2019-01-18 DIAGNOSIS — F332 Major depressive disorder, recurrent severe without psychotic features: Secondary | ICD-10-CM | POA: Diagnosis not present

## 2019-01-18 MED ORDER — CITALOPRAM HYDROBROMIDE 40 MG PO TABS: 40.0000 mg | ORAL_TABLET | Freq: Every day | ORAL | 2 refills | Status: DC

## 2019-01-18 MED ORDER — HYDROXYZINE HCL 10 MG PO TABS: 10.0000 mg | ORAL_TABLET | Freq: Three times a day (TID) | ORAL | 1 refills | Status: DC | PRN

## 2019-01-18 NOTE — Progress Notes (Signed)
Patient: Stefanie Fitzgerald Female    DOB: November 27, 1990   28 y.o.   MRN: [id number] Visit Date: 01/18/2019  Today's Provider: Shirlee LatchAngela Subhan Hoopes, MD   Chief Complaint  Patient presents with  . Anxiety   Subjective:    Virtual Visit via Video Note  I connected with Stefanie MoynahanSara Elizabeth Fitzgerald on 01/18/19 at  9:00 AM EDT by a video enabled telemedicine application and verified that I am speaking with the correct person using two identifiers.   Patient location: home [facility] Persons involved in the visit: patient, provider   I discussed the limitations of evaluation and management by telemedicine and the availability of in person appointments. The patient expressed understanding and agreed to proceed.  HPI Anxiety:  Patient presents for a 1 month follow up. Last OV was on 12/18/2018. Patient advised to restart Celexa 20 mg daily. Patient reports good compliance with treatment plan. She states symptoms were improving until last week. She states symptoms are worse. She reports loss of concentration, difficulty sleeping and irritability.   She was doing well, but then there were big changes at work in the last few weeks and the pressure is getting to her.  GAD 7 : Generalized Anxiety Score 01/18/2019 12/18/2018 04/28/2018 09/07/2017  Nervous, Anxious, on Edge 3 2 0 1  Control/stop worrying 2 2 0 0  Worry too much - different things 2 2 0 1  Trouble relaxing 2 3 0 0  Restless 3 3 0 0  Easily annoyed or irritable 2 3 0 1  Afraid - awful might happen 1 1 0 0  Total GAD 7 Score 15 16 0 3  Anxiety Difficulty Very difficult Somewhat difficult Not difficult at all Not difficult at all   [facility] 01/18/2019 12/18/2018 04/28/2018 09/07/2017 06/06/2017  Decreased Interest 3 1 1  0 0  Down, Depressed, Hopeless 2 1 0 0 0  PHQ - 2 Score 5 2 1  0 0  Altered sleeping 3 3 1 3 1   Tired, decreased energy 3 3 0 2 0  Change in  appetite 3 2 0 0 1  Feeling bad or failure about yourself  2 1 0 0 1  Trouble concentrating 2 2 1  0 0  Moving slowly or fidgety/restless 3 3 1 2 1   Suicidal thoughts 0 0 0 0 0  PHQ-9 Score 21 16 4 7 4   Difficult doing work/chores Extremely dIfficult Very difficult Not difficult at all Somewhat difficult -     Allergies  Allergen Reactions  . Oxycodone Itching and Swelling     Current Outpatient Medications:  .  citalopram (CELEXA) 40 MG tablet, Take 1 tablet (40 mg total) by mouth daily., Disp: 90 tablet, Rfl: 2 .  medroxyPROGESTERone (DEPO-PROVERA) 150 MG/ML injection, Inject 1 mL (150 mg total) into the muscle every 3 (three) months. Administ[facility] by [facility], Disp: 1 mL, Rfl: 3 .  hydrOXYzine (ATARAX/VISTARIL) 10 MG tablet, Take 1 tablet (10 mg total) by mouth 3 (three) times daily as needed., Disp: 30 tablet, Rfl: 1  Review of Systems  Constitutional: Negative.   Respiratory: Negative.   Cardiovascular: Negative.   Musculoskeletal: Negative.   Psychiatric/Behavioral: Positive for decreased concentration and sleep disturbance. The patient is nervous/anxious.     Social History   Tobacco Use  . Smoking status: Never Smoker  . Smokeless tobacco: Never Used  Substance Use Topics  . Alcohol use: Yes    Comment: 1-2  glasses of wine per month      Objective:   There were no vitals taken for this visit. There were no vitals filed for this visit.   Physical Exam Constitutional:      Appearance: Normal appearance.  Pulmonary:     Effort: Pulmonary effort is normal. No respiratory distress.  Neurological:     Mental Status: She is alert and oriented to person, place, and time.  Psychiatric:        Attention and Perception: Attention normal.        Mood and Affect: Mood is anxious and depressed. Affect is tearful.        Speech: Speech normal.        Behavior: Behavior normal.        Thought Content: Thought content does not include homicidal or suicidal  ideation.         Assessment & Plan    I discussed the assessment and treatment plan with the patient. The patient was provided an opportunity to ask questions and all were answered. The patient agreed with the plan and demonstrated an understanding of the instructions.   The patient was advised to call back or seek an in-person evaluation if the symptoms worsen or if the condition fails to improve as anticipated.  Problem List Items Addressed This Visit      Other   GAD (generalized anxiety disorder) - Primary    Severe and uncontrolled Worsened in the setting of current pandemic and more responsibilities and stress at work Increase Celexa to 40 mg daily May use hydroxyzine 10 mg 3 times daily as needed for anxiety or sleep Discussed importance of therapy and patient will call to make appointment with her previous therapist Follow-up in 4 to 6 weeks Repeat PHQ 9 and GAD 7 screenings at next visit      Relevant Medications   citalopram (CELEXA) 40 MG tablet   hydrOXYzine (ATARAX/VISTARIL) 10 MG tablet   MDD (major depressive disorder)    Severe and uncontrolled Worsened by current pandemic and stress at work Increase Celexa to 40 mg daily Discussed importance of therapy Contracted for safety-no SI/HI Follow-up in 4 to 6 weeks Repeat PHQ 9 and GAD 7 screenings at next visit      Relevant Medications   citalopram (CELEXA) 40 MG tablet   hydrOXYzine (ATARAX/VISTARIL) 10 MG tablet   Palpitations    Holter monitor benign with some runs of sinus tachycardia, and SVT cannot be excluded Palpitations likely reflective of the sinus tachycardia Likely 2/2 anxiety Will treat as above          Return in about 4 weeks (around 02/15/2019) for anxiety/depression f/u.   The entirety of the information documented in the History of Present Illness, Review of Systems and Physical Exam were personally obtained by me. Portions of this information were initially documented by Presley Raddle, CMA and reviewed by me for thoroughness and accuracy.    Mumin Denomme, Marzella Schlein, MD MPH [facility]

## 2019-01-18 NOTE — Assessment & Plan Note (Signed)
Severe and uncontrolled Worsened in the setting of current pandemic and more responsibilities and stress at work Increase Celexa to 40 mg daily May use hydroxyzine 10 mg 3 times daily as needed for anxiety or sleep Discussed importance of therapy and patient will call to make appointment with her previous therapist Follow-up in 4 to 6 weeks Repeat PHQ 9 and GAD 7 screenings at next visit

## 2019-01-18 NOTE — Assessment & Plan Note (Signed)
Holter monitor benign with some runs of sinus tachycardia, and SVT cannot be excluded Palpitations likely reflective of the sinus tachycardia Likely 2/2 anxiety Will treat as above

## 2019-01-18 NOTE — Assessment & Plan Note (Signed)
Severe and uncontrolled Worsened by current pandemic and stress at work Increase Celexa to 40 mg daily Discussed importance of therapy Contracted for safety-no SI/HI Follow-up in 4 to 6 weeks Repeat PHQ 9 and GAD 7 screenings at next visit

## 2019-03-01 ENCOUNTER — Ambulatory Visit (INDEPENDENT_AMBULATORY_CARE_PROVIDER_SITE_OTHER): Payer: 59 | Admitting: Family Medicine

## 2019-03-01 DIAGNOSIS — F3341 Major depressive disorder, recurrent, in partial remission: Secondary | ICD-10-CM | POA: Diagnosis not present

## 2019-03-01 DIAGNOSIS — F411 Generalized anxiety disorder: Secondary | ICD-10-CM

## 2019-03-01 MED ORDER — CITALOPRAM HYDROBROMIDE 40 MG PO TABS: 40.0000 mg | ORAL_TABLET | Freq: Every day | ORAL | 2 refills | Status: DC

## 2019-03-01 NOTE — Assessment & Plan Note (Signed)
Chronic and now well controlled Previously worsened in the setting of current pandemic and more responsibilities and stress at work Continue Celexa 40 mg daily May use hydroxyzine 10 mg 3 times daily as needed for anxiety or sleep Again discussed the importance of therapy

## 2019-03-01 NOTE — Assessment & Plan Note (Signed)
Chronic and well-controlled Previously worsened by current pandemic and stress at work Continue Celexa 40 mg daily Discussed the importance of therapy Contracted for safety-no SI Follow-up in 3 to 6 months Is stable and doing well at 6 months, could consider down titrating medication again

## 2019-03-01 NOTE — Progress Notes (Signed)
Patient: Stefanie FlemingsSara Elizabeth Fitzgerald Female    DOB: July 18, 1991   27 y.o.   MRN: [id number] Visit Date: 03/01/2019  Today's Provider: Shirlee LatchAngela Karlin Heilman, MD   Chief Complaint  Patient presents with  . Anxiety  . Depression   Subjective:    I, [name] CMA, am acting as a scribe for Shirlee LatchAngela Jerry Clyne, MD.   Virtual Visit via Video Note  I connected with Stefanie Fitzgerald on 03/01/19 at  9:40 AM EDT by a video enabled telemedicine application and verified that I am speaking with the correct person using two identifiers.   Patient location: home [facility] Persons involved in the visit: patient, provider    I discussed the limitations of evaluation and management by telemedicine and the availability of in person appointments. The patient expressed understanding and agreed to proceed.  HPI  Anxiety/ Depression Patient presents today for 6 weeks follow-up for anxiety and depression. Patient last office visit was 01/18/2019. Patient states at last visit that her anxiety/ depression is worse due to the pandemic and work relate stress but today 03/01/2019 patient states that her anxiety/ depression has improved. Patient Celexa was increased to 40 mg daily and was advised she may take 10 mg of Hydroxyzine 3 times daily as need for sleep. Patient reports good compliance with treatment.  She has also taken some time off of work.  This has helped her step away from the stressors.  GAD 7 : Generalized Anxiety Score 03/01/2019 01/18/2019 12/18/2018 04/28/2018  Nervous, Anxious, on Edge 1 3 2  0  Control/stop worrying 1 2 2  0  Worry too much - different things 1 2 2  0  Trouble relaxing 0 2 3 0  Restless 1 3 3  0  Easily annoyed or irritable 1 2 3  0  Afraid - awful might happen 0 1 1 0  Total GAD 7 Score 5 15 16  0  Anxiety Difficulty Not difficult at all Very difficult Somewhat difficult Not difficult at all   [facility] screen [facility]  03/01/2019 01/18/2019 12/18/2018 04/28/2018 09/07/2017  Decreased Interest 0 3 1 1  0  Down, Depressed, Hopeless 1 2 1  0 0  PHQ - 2 Score 1 5 2 1  0  Altered sleeping 0 3 3 1 3   Tired, decreased energy 1 3 3  0 2  Change in appetite 1 3 2  0 0  Feeling bad or failure about yourself  0 2 1 0 0  Trouble concentrating 1 2 2 1  0  Moving slowly or fidgety/restless 0 3 3 1 2   Suicidal thoughts 0 0 0 0 0  PHQ-9 Score 4 21 16 4 7   Difficult doing work/chores Not difficult at all Extremely dIfficult Very difficult Not difficult at all Somewhat difficult      Allergies  Allergen Reactions  . Oxycodone Itching and Swelling     Current Outpatient Medications:  .  citalopram (CELEXA) 40 MG tablet, Take 1 tablet (40 mg total) by mouth daily., Disp: 90 tablet, Rfl: 2 .  hydrOXYzine (ATARAX/VISTARIL) 10 MG tablet, Take 1 tablet (10 mg total) by mouth 3 (three) times daily as needed., Disp: 30 tablet, Rfl: 1 .  medroxyPROGESTERone (DEPO-PROVERA) 150 MG/ML injection, Inject 1 mL (150 mg total) into the muscle every 3 (three) months. [facility] by [facility], Disp: 1 mL, Rfl: 3  Review of Systems  Constitutional: Negative.   Respiratory: Negative.   Cardiovascular: Negative.   Neurological: Negative.   Hematological: Negative.  Psychiatric/Behavioral: Positive for agitation, decreased concentration and sleep disturbance. The patient is nervous/anxious.     Social History   Tobacco Use  . Smoking status: Never Smoker  . Smokeless tobacco: Never Used  Substance Use Topics  . Alcohol use: Yes    Comment: 1-2 glasses of wine per month      Objective:   There were no vitals taken for this visit. There were no vitals filed for this visit.   Physical Exam Constitutional:      Appearance: Normal appearance.  Eyes:     General: No scleral icterus.    Conjunctiva/sclera: Conjunctivae normal.  Pulmonary:     Effort: Pulmonary effort is normal. No respiratory distress.  Neurological:      Mental Status: She is alert and oriented to person, place, and time. Mental status is at baseline.  Psychiatric:        Attention and Perception: Attention normal.        Mood and Affect: Mood and affect normal.        Behavior: Behavior normal.        Thought Content: Thought content does not include homicidal or suicidal ideation.      No results found for any visits on 03/01/19.     Assessment & Plan    I discussed the assessment and treatment plan with the patient. The patient was provided an opportunity to ask questions and all were answered. The patient agreed with the plan and demonstrated an understanding of the instructions.   The patient was advised to call back or seek an in-person evaluation if the symptoms worsen or if the condition fails to improve as anticipated.  Problem List Items Addressed This Visit      Other   GAD (generalized anxiety disorder) - Primary    Chronic and now well controlled Previously worsened in the setting of current pandemic and more responsibilities and stress at work Continue Celexa 40 mg daily May use hydroxyzine 10 mg 3 times daily as needed for anxiety or sleep Again discussed the importance of therapy      Relevant Medications   citalopram (CELEXA) 40 MG tablet   MDD (major depressive disorder)    Chronic and well-controlled Previously worsened by current pandemic and stress at work Continue Celexa 40 mg daily Discussed the importance of therapy Contracted for safety-no SI Follow-up in 3 to 6 months Is stable and doing well at 6 months, could consider down titrating medication again      Relevant Medications   citalopram (CELEXA) 40 MG tablet       Return in about 6 weeks (around 04/12/2019) for CPE as scheduled.   The entirety of the information documented in the History of Present Illness, Review of Systems and Physical Exam were personally obtained by me. Portions of this information were initially documented by [facility], CMA and reviewed by me for thoroughness and accuracy.    Jaesean Litzau, Dionne Bucy, MD MPH [facility]

## 2019-03-09 ENCOUNTER — Other Ambulatory Visit: Payer: Self-pay

## 2019-03-09 ENCOUNTER — Ambulatory Visit (INDEPENDENT_AMBULATORY_CARE_PROVIDER_SITE_OTHER): Payer: 59

## 2019-03-09 DIAGNOSIS — Z3042 Encounter for surveillance of injectable contraceptive: Secondary | ICD-10-CM | POA: Diagnosis not present

## 2019-03-09 MED ORDER — MEDROXYPROGESTERONE ACETATE 150 MG/ML IM SUSP
150.0000 mg | Freq: Once | INTRAMUSCULAR | Status: AC
  Administered 2019-03-09: 150 mg via INTRAMUSCULAR

## 2019-03-09 NOTE — Progress Notes (Signed)
Pt here for depo which was given IM right glut.  NDC# [address]-1 

## 2019-04-18 ENCOUNTER — Encounter: Payer: Self-pay | Admitting: Family Medicine

## 2019-04-18 ENCOUNTER — Ambulatory Visit (INDEPENDENT_AMBULATORY_CARE_PROVIDER_SITE_OTHER): Payer: 59 | Admitting: Family Medicine

## 2019-04-18 ENCOUNTER — Other Ambulatory Visit: Payer: Self-pay

## 2019-04-18 VITALS — BP 117/73 | HR 84 | Temp 96.8°F | Ht 66.0 in | Wt 167.4 lb

## 2019-04-18 DIAGNOSIS — F3341 Major depressive disorder, recurrent, in partial remission: Secondary | ICD-10-CM

## 2019-04-18 DIAGNOSIS — M7052 Other bursitis of knee, left knee: Secondary | ICD-10-CM | POA: Diagnosis not present

## 2019-04-18 DIAGNOSIS — E663 Overweight: Secondary | ICD-10-CM

## 2019-04-18 DIAGNOSIS — Z114 Encounter for screening for human immunodeficiency virus [HIV]: Secondary | ICD-10-CM | POA: Diagnosis not present

## 2019-04-18 DIAGNOSIS — F411 Generalized anxiety disorder: Secondary | ICD-10-CM

## 2019-04-18 DIAGNOSIS — S93492A Sprain of other ligament of left ankle, initial encounter: Secondary | ICD-10-CM | POA: Diagnosis not present

## 2019-04-18 DIAGNOSIS — Z0000 Encounter for general adult medical examination without abnormal findings: Secondary | ICD-10-CM | POA: Diagnosis not present

## 2019-04-18 MED ORDER — MELOXICAM 15 MG PO TABS: 15.0000 mg | ORAL_TABLET | Freq: Every day | ORAL | 0 refills | Status: DC

## 2019-04-18 NOTE — Patient Instructions (Addendum)
Preventive Care 21-29 Years Old, Female Preventive care refers to visits with your health care provider and lifestyle choices that can promote health and wellness. This includes:  A yearly physical exam. This may also be called an annual well check.  Regular dental visits and eye exams.  Immunizations.  Screening for certain conditions.  Healthy lifestyle choices, such as eating a healthy diet, getting regular exercise, not using drugs or products that contain nicotine and tobacco, and limiting alcohol use. What can I expect for my preventive care visit? Physical exam Your health care provider will check your:  Height and weight. This may be used to calculate body mass index (BMI), which tells if you are at a healthy weight.  Heart rate and blood pressure.  Skin for abnormal spots. Counseling Your health care provider may ask you questions about your:  Alcohol, tobacco, and drug use.  Emotional well-being.  Home and relationship well-being.  Sexual activity.  Eating habits.  Work and work environment.  Method of birth control.  Menstrual cycle.  Pregnancy history. What immunizations do I need?  Influenza (flu) vaccine  This is recommended every year. Tetanus, diphtheria, and pertussis (Tdap) vaccine  You may need a Td booster every 10 years. Varicella (chickenpox) vaccine  You may need this if you have not been vaccinated. Human papillomavirus (HPV) vaccine  If recommended by your health care provider, you may need three doses over 6 months. Measles, mumps, and rubella (MMR) vaccine  You may need at least one dose of MMR. You may also need a second dose. Meningococcal conjugate (MenACWY) vaccine  One dose is recommended if you are age 19-21 years and a first-year college student living in a residence hall, or if you have one of several medical conditions. You may also need additional booster doses. Pneumococcal conjugate (PCV13) vaccine  You may need  this if you have certain conditions and were not previously vaccinated. Pneumococcal polysaccharide (PPSV23) vaccine  You may need one or two doses if you smoke cigarettes or if you have certain conditions. Hepatitis A vaccine  You may need this if you have certain conditions or if you travel or work in places where you may be exposed to hepatitis A. Hepatitis B vaccine  You may need this if you have certain conditions or if you travel or work in places where you may be exposed to hepatitis B. Haemophilus influenzae type b (Hib) vaccine  You may need this if you have certain conditions. You may receive vaccines as individual doses or as more than one vaccine together in one shot (combination vaccines). Talk with your health care provider about the risks and benefits of combination vaccines. What tests do I need?  Blood tests  Lipid and cholesterol levels. These may be checked every 5 years starting at age 20.  Hepatitis C test.  Hepatitis B test. Screening  Diabetes screening. This is done by checking your blood sugar (glucose) after you have not eaten for a while (fasting).  Sexually transmitted disease (STD) testing.  BRCA-related cancer screening. This may be done if you have a family history of breast, ovarian, tubal, or peritoneal cancers.  Pelvic exam and Pap test. This may be done every 3 years starting at age 21. Starting at age 30, this may be done every 5 years if you have a Pap test in combination with an HPV test. Talk with your health care provider about your test results, treatment options, and if necessary, the need for more tests.   Follow these instructions at home: Eating and drinking   Eat a diet that includes fresh fruits and vegetables, whole grains, lean protein, and low-fat dairy.  Take vitamin and mineral supplements as recommended by your health care provider.  Do not drink alcohol if: ? Your health care provider tells you not to drink. ? You are  pregnant, may be pregnant, or are planning to become pregnant.  If you drink alcohol: ? Limit how much you have to 0-1 drink a day. ? Be aware of how much alcohol is in your drink. In the [place] one drink equals one 12 oz bottle of beer (355 mL), one 5 oz glass of wine (148 mL), or one 1 oz glass of hard liquor (44 mL). Lifestyle  Take daily care of your teeth and gums.  Stay active. Exercise for at least 30 minutes on 5 or more days each week.  Do not use any products that contain nicotine or tobacco, such as cigarettes, e-cigarettes, and chewing tobacco. If you need help quitting, ask your health care provider.  If you are sexually active, practice safe sex. Use a condom or other form of birth control (contraception) in order to prevent pregnancy and STIs (sexually transmitted infections). If you plan to become pregnant, see your health care provider for a preconception visit. What's next?  Visit your health care provider once a year for a well check visit.  Ask your health care provider how often you should have your eyes and teeth checked.  Stay up to date on all vaccines. This information is not intended to replace advice given to you by your health care provider. Make sure you discuss any questions you have with your health care provider. Document Released: 10/05/2001 Document Revised: 04/20/2018 Document Reviewed: 04/20/2018 Elsevier Patient Education  [place].    Ankle Exercises Ask your health care provider which exercises are safe for you. Do exercises exactly as told by your health care provider and adjust them as directed. It is normal to feel mild stretching, pulling, tightness, or mild discomfort as you do these exercises. Stop right away if you feel sudden pain or your pain gets worse. Do not begin these exercises until told by your health care provider. Stretching and range-of-motion exercises These exercises warm up your muscles and joints and improve the  movement and flexibility of your ankle. These exercises may also help to relieve pain. Dorsiflexion/plantar flexion  1. Sit with your __________ knee straight or bent. Do not rest your foot on anything. 2. Flex your __________ ankle to tilt the top of your foot toward your shin. This is called dorsiflexion. 3. Hold this position for __________ seconds. 4. Point your toes downward to tilt the top of your foot away from your shin. This is called plantar flexion. 5. Hold this position for __________ seconds. Repeat __________ times. Complete this exercise __________ times a day. Ankle alphabet  1. Sit with your __________ foot supported at your lower leg. ? Do not rest your foot on anything. ? Make sure your foot has room to move freely. 2. Think of your __________ foot as a paintbrush: ? Move your foot to trace each letter of the alphabet in the air. Keep your hip and knee still while you trace the letters. Trace every letter from A to Z. ? Make the letters as large as you can without causing or increasing any discomfort. Repeat __________ times. Complete this exercise __________ times a day. Passive ankle dorsiflexion This is  an exercise in which something or someone moves your ankle for you. You do not move it yourself. 1. Sit on a chair that is placed on a non-carpeted surface. 2. Place your __________ foot on the floor, directly under your __________ knee. Extend your __________ leg for support. 3. Keeping your heel down, slide your __________ foot back toward the chair until you feel a stretch at your ankle or calf. If you do not feel a stretch, slide your buttocks forward to the edge of the chair while keeping your heel down. 4. Hold this stretch for __________ seconds. Repeat __________ times. Complete this exercise __________ times a day. Strengthening exercises These exercises build strength and endurance in your ankle. Endurance is the ability to use your muscles for a long time,  even after they get tired. Dorsiflexors These are muscles that lift your foot up. 1. Secure a rubber exercise band or tube to an object, such as a table leg, that will stay still when the band is pulled. Secure the other end around your __________ foot. 2. Sit on the floor, facing the object with your __________ leg extended. The band or tube should be slightly tense when your foot is relaxed. 3. Slowly flex your __________ ankle and toes to bring your foot toward your shin. 4. Hold this position for __________ seconds. 5. Slowly return your foot to the starting position, controlling the band as you do that. Repeat __________ times. Complete this exercise __________ times a day. Plantar flexors These are muscles that push your foot down. 1. Sit on the floor with your __________ leg extended. 2. Loop a rubber exercise band or tube around the ball of your __________ foot. The ball of your foot is on the walking surface, right under your toes. The band or tube should be slightly tense when your foot is relaxed. 3. Slowly point your toes downward, pushing them away from you. 4. Hold this position for __________ seconds. 5. Slowly release the tension in the band or tube, controlling smoothly until your foot is back in the starting position. Repeat __________ times. Complete this exercise __________ times a day. Towel curls  1. Sit in a chair on a non-carpeted surface, and put your feet on the floor. 2. Place a towel in front of your feet. If told by your health care provider, add a __________ pound weight to the end of the towel. 3. Keeping your heel on the floor, put your __________ foot on the towel. 4. Pull the towel toward you by grabbing the towel with your toes and curling them under. Keep your heel on the floor. 5. Let your toes relax. 6. Grab the towel again. Keep pulling the towel until it is completely underneath your foot. Repeat __________ times. Complete this exercise __________  times a day. Standing plantar flexion This is an exercise in which you use your toes to lift your body's weight while standing. 1. Stand with your feet shoulder-width apart. 2. Keep your weight spread evenly over the width of your feet while you rise up on your toes. Use a wall or table to steady yourself if needed, but try not to use it for support. 3. If this exercise is too easy, try these options: ? Shift your weight toward your __________ leg until you feel challenged. ? If told by your health care provider, lift your uninjured leg off the floor. 4. Hold this position for __________ seconds. Repeat __________ times. Complete this exercise __________ times a day.  Tandem walking 1. Stand with one foot directly in front of the other. 2. Slowly raise your back foot up, lifting your heel before your toes, and place it directly in front of your other foot. 3. Continue to walk in this heel-to-toe way for __________ or for as long as told by your health care provider. Have a countertop or wall nearby to use if needed to keep your balance, but try not to hold onto anything for support. Repeat __________ times. Complete this exercise __________ times a day. This information is not intended to replace advice given to you by your health care provider. Make sure you discuss any questions you have with your health care provider. Document Released: 06/23/2005 Document Revised: 05/06/2018 Document Reviewed: 05/08/2018 Elsevier Patient Education  [place].  Pes Anserine Bursitis Rehab Ask your health care provider which exercises are safe for you. Do exercises exactly as told by your health care provider and adjust them as directed. It is normal to feel mild stretching, pulling, tightness, or discomfort as you do these exercises. Stop right away if you feel sudden pain or your pain gets worse. Do not begin these exercises until told by your health care provider. Stretching and range-of-motion  exercises These exercises warm up your muscles and joints and improve the movement and flexibility of your knee. These exercises also help to relieve pain and stiffness. Hamstring stretch, doorway  1. Lie on your back in front of a doorway with your left / right leg resting against the wall and your other leg flat on the floor in the doorway. There should be a slight bend in your left / right knee. 2. Straighten your left / right knee. You should feel a stretch behind your knee or thigh (hamstring). If you do not, scoot your buttocks closer to the door. 3. Hold this position for __________ seconds. Repeat __________ times. Complete this exercise __________ times a day. Seated stretch This exercise is sometimes called hamstrings and adductors stretch. 1. Sit on the floor with your legs stretched wide. Keep your knees straight during this exercise. 2. Keeping your head and back in a straight line, bend at your waist to reach for your left foot (position A). You should feel a stretch in your right inner thigh (adductors). 3. Hold for __________ seconds. Then slowly return to the upright position. 4. Keeping your head and back in a straight line, bend at your waist to reach forward (position B). You should feel a stretch behind both of your thighs or knees (hamstrings). 5. Hold for __________ seconds. Then slowly return to the upright position. 6. Keeping your head and back in a straight line, bend at your waist to reach for your right foot (position C). You should feel a stretch in your left inner thigh (adductors). 7. Hold for __________ seconds. Then slowly return to the upright position. Repeat __________ times. Complete this exercise __________ times a day. Quadriceps, prone  1. Lie on your abdomen on a firm surface, such as a bed or padded floor (prone position). 2. Bend your left / right knee and hold your ankle. If you cannot reach your ankle or pant leg, loop a belt around your foot and  grab the belt instead. 3. Gently pull your heel toward your buttocks. Your knee should not slide out to the side. You should feel a stretch in the front of your thigh and knee (quadriceps). 4. Hold this position for __________ seconds. Repeat __________ times. Complete this exercise  __________ times a day. Strengthening exercises These exercises build strength and endurance in your knee and hip. Endurance is the ability to use your muscles for a long time, even after they get tired. Quadriceps terminal knee extension 1. Secure a long loop of rubber exercise band around a sturdy object like a table leg. 2. Put the band behind your left / right knee. Step back from where the band is secured to put tension on the band. 3. Slowly bend your left / right knee. Keep your left / right foot flat on the floor. 4. Tighten the muscle in the front of your thigh (quadriceps) and push back against the band to straighten your knee until it is completely straight (terminal extension). 5. Hold this position for __________ seconds. 6. Return to having your left / right knee bent. Repeat __________ times. Complete this exercise __________ times a day. Straight leg raises, side-lying This exercise strengthens the muscles that rotate the leg at the hip and move it away from your body (hip abductors). 1. Lie on your side with your left / right leg in the top position. Lie so your head, shoulder, hip, and knee line up. You may bend your bottom knee to help you balance. 2. Lift your top leg 4-6 inches (10-15 cm) while keeping your toes pointed straight ahead. 3. Hold this position for __________ seconds. 4. Slowly lower your leg to the starting position. 5. Allow your muscles to relax completely after each repetition. Repeat __________ times. Complete this exercise __________ times a day. This information is not intended to replace advice given to you by your health care provider. Make sure you discuss any questions  you have with your health care provider. Document Released: 08/09/2005 Document Revised: 11/30/2018 Document Reviewed: 06/01/2018 Elsevier Patient Education  2020 [employer].
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
## 2019-04-28 LAB — COMPREHENSIVE METABOLIC PANEL
ALT: 15 IU/L (ref 0–32)
AST: 18 IU/L (ref 0–40)
Albumin/Globulin Ratio: 1.6 (ref 1.2–2.2)
Albumin: 4.4 g/dL (ref 3.9–5.0)
Alkaline Phosphatase: 60 IU/L (ref 39–117)
BUN/Creatinine Ratio: 12 (ref 9–23)
BUN: 11 mg/dL (ref 6–20)
Bilirubin Total: 0.5 mg/dL (ref 0.0–1.2)
CO2: 24 mmol/L (ref 20–29)
Calcium: 9.8 mg/dL (ref 8.7–10.2)
Chloride: 105 mmol/L (ref 96–106)
Creatinine, Ser: 0.89 mg/dL (ref 0.57–1.00)
GFR calc Af Amer: 101 mL/min/{1.73_m2} (ref >59)
GFR calc non Af Amer: 88 mL/min/{1.73_m2} (ref >59)
Globulin, Total: 2.7 g/dL (ref 1.5–4.5)
Glucose: 96 mg/dL (ref 65–99)
Potassium: 4.6 mmol/L (ref 3.5–5.2)
Sodium: 141 mmol/L (ref 134–144)
Total Protein: 7.1 g/dL (ref 6.0–8.5)

## 2019-04-28 LAB — CBC WITH DIFFERENTIAL/PLATELET
Basophils Absolute: 0 10*3/uL (ref 0.0–0.2)
Basos: 1 %
EOS (ABSOLUTE): 0.4 10*3/uL (ref 0.0–0.4)
Eos: 5 %
Hematocrit: 40.2 % (ref 34.0–46.6)
Hemoglobin: 13.2 g/dL (ref 11.1–15.9)
Immature Grans (Abs): 0 10*3/uL (ref 0.0–0.1)
Immature Granulocytes: 0 %
Lymphocytes Absolute: 2.7 10*3/uL (ref 0.7–3.1)
Lymphs: 32 %
MCH: 28.4 pg (ref 26.6–33.0)
MCHC: 32.8 g/dL (ref 31.5–35.7)
MCV: 87 fL (ref 79–97)
Monocytes Absolute: 0.6 10*3/uL (ref 0.1–0.9)
Monocytes: 7 %
Neutrophils Absolute: 4.7 10*3/uL (ref 1.4–7.0)
Neutrophils: 55 %
Platelets: 214 10*3/uL (ref 150–450)
RBC: 4.64 x10E6/uL (ref 3.77–5.28)
RDW: 12.5 % (ref 11.7–15.4)
WBC: 8.5 10*3/uL (ref 3.4–10.8)

## 2019-04-28 LAB — HIV ANTIBODY (ROUTINE TESTING W REFLEX): HIV Screen 4th Generation wRfx: NONREACTIVE

## 2019-04-28 LAB — TSH: TSH: 2.23 u[IU]/mL (ref 0.450–4.500)

## 2019-05-02 ENCOUNTER — Telehealth: Payer: Self-pay

## 2019-05-02 NOTE — Telephone Encounter (Signed)
-----   Message from Virginia Crews, MD sent at 05/02/2019 12:36 PM EDT ----- Normal labs

## 2019-05-02 NOTE — Telephone Encounter (Signed)
Left message advising pt (per DPR).   Thanks,   -Josefa Syracuse  

## 2019-05-13 ENCOUNTER — Other Ambulatory Visit: Payer: Self-pay | Admitting: Family Medicine

## 2019-05-14 NOTE — Telephone Encounter (Signed)
L.O.V. was 04/18/2019.

## 2019-05-28 ENCOUNTER — Other Ambulatory Visit: Payer: Self-pay | Admitting: Family Medicine

## 2019-06-01 ENCOUNTER — Other Ambulatory Visit: Payer: Self-pay

## 2019-06-01 ENCOUNTER — Ambulatory Visit (INDEPENDENT_AMBULATORY_CARE_PROVIDER_SITE_OTHER): Payer: 59

## 2019-06-01 DIAGNOSIS — Z3042 Encounter for surveillance of injectable contraceptive: Secondary | ICD-10-CM | POA: Diagnosis not present

## 2019-06-01 MED ORDER — MEDROXYPROGESTERONE ACETATE 150 MG/ML IM SUSP
150.0000 mg | Freq: Once | INTRAMUSCULAR | Status: AC
  Administered 2019-06-01: 150 mg via INTRAMUSCULAR

## 2019-06-15 ENCOUNTER — Other Ambulatory Visit: Payer: Self-pay | Admitting: Family Medicine

## 2019-08-02 ENCOUNTER — Ambulatory Visit: Payer: 59 | Admitting: Family Medicine

## 2019-08-25 ENCOUNTER — Other Ambulatory Visit: Payer: Self-pay | Admitting: Obstetrics and Gynecology

## 2019-08-25 DIAGNOSIS — Z3042 Encounter for surveillance of injectable contraceptive: Secondary | ICD-10-CM

## 2019-08-27 ENCOUNTER — Encounter: Payer: Self-pay | Admitting: Obstetrics and Gynecology

## 2019-08-27 ENCOUNTER — Other Ambulatory Visit: Payer: Self-pay

## 2019-08-27 ENCOUNTER — Ambulatory Visit (INDEPENDENT_AMBULATORY_CARE_PROVIDER_SITE_OTHER): Payer: No Typology Code available for payment source | Admitting: Obstetrics and Gynecology

## 2019-08-27 VITALS — BP 102/62 | HR 90 | Ht 66.0 in | Wt 179.0 lb

## 2019-08-27 DIAGNOSIS — Z01419 Encounter for gynecological examination (general) (routine) without abnormal findings: Secondary | ICD-10-CM | POA: Diagnosis not present

## 2019-08-27 DIAGNOSIS — Z3042 Encounter for surveillance of injectable contraceptive: Secondary | ICD-10-CM | POA: Diagnosis not present

## 2019-08-27 MED ORDER — MEDROXYPROGESTERONE ACETATE 150 MG/ML IM SUSP: 150.0000 mg | INTRAMUSCULAR | 3 refills | Status: DC

## 2019-08-27 MED ORDER — MEDROXYPROGESTERONE ACETATE 150 MG/ML IM SUSP
150.0000 mg | Freq: Once | INTRAMUSCULAR | Status: AC
  Administered 2019-08-27: 15:00:00 150 mg via INTRAMUSCULAR

## 2019-08-27 NOTE — Progress Notes (Signed)
PCP:  Erasmo Downer, MD   Chief Complaint  Patient presents with  . Gynecologic Exam    bloating/possibly d/t later getting depo than normal (~1 wk later)  . Contraception    Depo Provera     HPI:      [name] is a 29 y.o. G0P0000 who LMP was No LMP recorded. Patient has had an injection., presents today for her annual examination.  Her menses are usually absent due to depo.  Dysmenorrhea none. She does not have intermenstrual bleeding. On depo for severe dysmen. Was a week late for depo this cycle and started having bloating, light bleeding, mood changes, and breast tenderness. Getting depo today.    Sex activity: not sexually active (has been in distant past) Last Pap: 08/09/18 Results were: no abnormalities . Use small speculum. Hx of STDs: chlamydia  There is no FH of breast cancer. There is no FH of ovarian cancer. The patient does do self-breast exams.  Tobacco use: The patient denies current or previous tobacco use. Alcohol use: social drinker No drug use.  Exercise: min active; has bursitis in LT knee  She does get adequate calcium and Vitamin D in her diet.   Past Medical History:  Diagnosis Date  . Anxiety   . Stress 2018   and panic    Past Surgical History:  Procedure Laterality Date  . 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 TONSILLECTOMY  1996  . WISDOM TOOTH EXTRACTION      Family History  Problem Relation Age of Onset  . Depression Mother   . Anxiety disorder Mother   . Healthy Father   . Anxiety disorder Sister   . Depression Brother   . Anxiety disorder Brother   . Lung cancer Maternal Grandfather        smoker  . Breast cancer Neg Hx   . Colon cancer Neg Hx     Social History   Socioeconomic History  . Marital status: Single    Spouse name: Not on file  . Number of children: 0  . Years of education: bachelor's  . Highest education level: Bachelor's degree (e.g., BA, AB, BS)  Occupational  History    Employer: [employer]    Comment: FULL TIME  Tobacco Use  . Smoking status: Never Smoker  . Smokeless tobacco: Never Used  Substance and Sexual Activity  . Alcohol use: Yes    Comment: 1-2 glasses of wine per month  . Drug use: No  . Sexual activity: Not Currently    Birth control/protection: Injection  Other Topics Concern  . Not on file  Social History Narrative  . Not on file   Social Determinants of Health   Financial Resource Strain:   . Difficulty of Paying Living Expenses: Not on file  Food Insecurity:   . Worried About Programme researcher, broadcasting/film/video in the Last Year: Not on file  . Ran Out of Food in the Last Year: Not on file  Transportation Needs:   . Lack of Transportation (Medical): Not on file  . Lack of Transportation (Non-Medical): Not on file  Physical Activity:   . Days of Exercise per Week: Not on file  . Minutes of Exercise per Session: Not on file  Stress:   . Feeling of Stress : Not on file  Social Connections:   . Frequency of Communication with Friends and Family: Not on file  . Frequency of Social Gatherings with Friends  and Family: Not on file  . Attends Religious Services: Not on file  . Active Member of Clubs or Organizations: Not on file  . Attends Banker Meetings: Not on file  . Marital Status: Not on file  Intimate Partner Violence:   . Fear of Current or Ex-Partner: Not on file  . Emotionally Abused: Not on file  . Physically Abused: Not on file  . Sexually Abused: Not on file    Current Meds  Medication Sig  . citalopram (CELEXA) 40 MG tablet Take 1 tablet (40 mg total) by mouth daily.  . hydrOXYzine (ATARAX/VISTARIL) 10 MG tablet TAKE 1 TABLET BY MOUTH THREE TIMES A DAY AS NEEDED  . medroxyPROGESTERone (DEPO-PROVERA) 150 MG/ML injection Inject 1 mL (150 mg total) into the muscle every 3 (three) months. Administered by [facility]  . meloxicam (MOBIC) 15 MG tablet TAKE 1 TABLET BY MOUTH EVERY DAY  . [DISCONTINUED]  medroxyPROGESTERone (DEPO-PROVERA) 150 MG/ML injection INJECT 1 ML (150 MG TOTAL) INTO THE MUSCLE EVERY 3 (THREE) MONTHS. ADMINISTERED BY [facility]     ROS:  Review of Systems  Constitutional: Negative for fatigue, fever and unexpected weight change.  Respiratory: Negative for cough, shortness of breath and wheezing.   Cardiovascular: Negative for chest pain, palpitations and leg swelling.  Gastrointestinal: Negative for blood in stool, constipation, diarrhea, nausea and vomiting.  Endocrine: Negative for cold intolerance, heat intolerance and polyuria.  Genitourinary: Negative for dyspareunia, dysuria, flank pain, frequency, genital sores, hematuria, menstrual problem, pelvic pain, urgency, vaginal bleeding, vaginal discharge and vaginal pain.  Musculoskeletal: Negative for back pain, joint swelling and myalgias.  Skin: Negative for rash.  Neurological: Negative for dizziness, syncope, light-headedness, numbness and headaches.  Hematological: Negative for adenopathy.  Psychiatric/Behavioral: Negative for agitation, confusion, sleep disturbance and suicidal ideas. The patient is not nervous/anxious.      Objective: BP 102/62 (BP Location: Right Arm, Patient Position: Sitting, Cuff Size: Normal)   Pulse 90   Ht 5\' 6"  (1.676 m)   Wt 179 lb (81.2 kg)   BMI 28.89 kg/m    Physical Exam Constitutional:      Appearance: She is well-developed.  Genitourinary:     Vulva, vagina, cervix, uterus, right adnexa and left adnexa normal.     No vulval lesion or tenderness noted.     No vaginal discharge, erythema or tenderness.     No cervical polyp.     Uterus is not enlarged or tender.     No right or left adnexal mass present.     Right adnexa not tender.     Left adnexa not tender.  Neck:     Thyroid: No thyromegaly.  Cardiovascular:     Rate and Rhythm: Normal rate and regular rhythm.     Heart sounds: Normal heart sounds. No murmur.  Pulmonary:     Effort: Pulmonary  effort is normal.     Breath sounds: Normal breath sounds.  Chest:     Breasts:        Right: No mass, nipple discharge, skin change or tenderness.        Left: No mass, nipple discharge, skin change or tenderness.  Abdominal:     Palpations: Abdomen is soft.     Tenderness: There is no abdominal tenderness. There is no guarding.  Musculoskeletal:        General: Normal range of motion.     Cervical back: Normal range of motion.  Neurological:  General: No focal deficit present.     Mental Status: She is alert and oriented to person, place, and time.     Cranial Nerves: No cranial nerve deficit.  Skin:    General: Skin is warm and dry.  Psychiatric:        Mood and Affect: Mood normal.        Behavior: Behavior normal.        Thought Content: Thought content normal.        Judgment: Judgment normal.  Vitals reviewed.     Assessment/Plan: Encounter for annual routine gynecological examination  Encounter for surveillance of injectable contraceptive - Depo RF. Cont calcium.  - Plan: medroxyPROGESTERone (DEPO-PROVERA) injection 150 mg, medroxyPROGESTERone (DEPO-PROVERA) 150 MG/ML injection  Encounter for surveillance of injectable contraceptive - Plan: medroxyPROGESTERone (DEPO-PROVERA) injection 150 mg, medroxyPROGESTERone (DEPO-PROVERA) 150 MG/ML injection  Meds ordered this encounter  Medications  . medroxyPROGESTERone (DEPO-PROVERA) injection 150 mg  . medroxyPROGESTERone (DEPO-PROVERA) 150 MG/ML injection    Sig: Inject 1 mL (150 mg total) into the muscle every 3 (three) months. Administered by [facility]    Dispense:  1 mL    Refill:  3    Order Specific Question:   Supervising Provider    Answer:   Gae Dry [[id number]]             GYN counsel adequate intake of calcium and vitamin D     F/U  Return in about 12 weeks (around 11/19/2019) for depo provera.  Toris Laverdiere B. Lailoni Baquera, PA-C 08/27/2019 3:40 PM

## 2019-08-27 NOTE — Progress Notes (Signed)
Patient presents today for Depo Provera injection within dates. Given IM LUOQ. Patient tolerated well. 

## 2019-08-27 NOTE — Patient Instructions (Signed)
I value your feedback and entrusting us with your care. If you get a [facility] patient survey, I would appreciate you taking the time to let us know about your experience today. Thank you!  As of August 02, 2019, your lab results will be released to your MyChart immediately, before I even have a chance to see them. Please give me time to review them and contact you if there are any abnormalities. Thank you for your patience.  

## 2019-11-19 ENCOUNTER — Other Ambulatory Visit: Payer: Self-pay

## 2019-11-19 ENCOUNTER — Ambulatory Visit (INDEPENDENT_AMBULATORY_CARE_PROVIDER_SITE_OTHER): Payer: 59

## 2019-11-19 DIAGNOSIS — Z3042 Encounter for surveillance of injectable contraceptive: Secondary | ICD-10-CM

## 2019-11-19 MED ORDER — MEDROXYPROGESTERONE ACETATE 150 MG/ML IM SUSP
150.0000 mg | Freq: Once | INTRAMUSCULAR | Status: AC
  Administered 2019-11-19: 150 mg via INTRAMUSCULAR

## 2019-11-19 NOTE — Progress Notes (Signed)
Pt here for depo which was given IM right glut.  NDC# [address]-1 

## 2019-11-22 ENCOUNTER — Ambulatory Visit: Payer: 59

## 2019-11-22 ENCOUNTER — Ambulatory Visit
Admission: RE | Admit: 2019-11-22 | Discharge: 2019-11-22 | Disposition: A | Discharge status: Home / Self Care | Payer: 59 | Attending: Family Medicine | Admitting: Family Medicine

## 2019-11-22 ENCOUNTER — Ambulatory Visit
Admission: RE | Admit: 2019-11-22 | Discharge: 2019-11-22 | Disposition: A | Discharge status: Home / Self Care | Payer: 59 | Source: Ambulatory Visit | Attending: Family Medicine | Admitting: Family Medicine

## 2019-11-22 ENCOUNTER — Encounter: Payer: Self-pay | Admitting: Family Medicine

## 2019-11-22 ENCOUNTER — Ambulatory Visit: Admission: RE | Admit: 2019-11-22 | Payer: 59 | Source: Home / Self Care | Admitting: *Deleted

## 2019-11-22 ENCOUNTER — Other Ambulatory Visit: Payer: Self-pay

## 2019-11-22 ENCOUNTER — Ambulatory Visit (INDEPENDENT_AMBULATORY_CARE_PROVIDER_SITE_OTHER): Payer: 59 | Admitting: Family Medicine

## 2019-11-22 VITALS — BP 106/74 | HR 60 | Temp 96.2°F | Wt 173.0 lb

## 2019-11-22 DIAGNOSIS — M79662 Pain in left lower leg: Secondary | ICD-10-CM | POA: Diagnosis not present

## 2019-11-22 DIAGNOSIS — M25562 Pain in left knee: Secondary | ICD-10-CM | POA: Insufficient documentation

## 2019-11-22 MED ORDER — MELOXICAM 15 MG PO TABS: 15.0000 mg | ORAL_TABLET | Freq: Every day | ORAL | 0 refills | Status: AC
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
## 2019-11-22 NOTE — Patient Instructions (Signed)
Shin Splints  Shin splints is a painful condition that is felt either on the bone that is located in the front of the lower leg (tibia or shin bone) or in the muscles on either side of the bone. This condition happens when physical activities lead to inflammation of the muscles, tendons, and the thin layer of tissue that covers the shin bone. It may result from participating in sports or other intense exercise. What are the causes? This condition may be caused by:  Overuse of muscles.  Repetitive activities.  Flat feet or rigid arches. Activities that could contribute to shin splints include:  Having a sudden increase in exercise time.  Starting a new, intense activity.  Running up hills or long distances.  Playing sports that involve sudden starts and stops.  Not warming up before activity.  Wearing old or worn-out shoes. What are the signs or symptoms? The main symptom of this condition is pain that occurs:  On the front of the lower leg.  In the muscles on either side of the shin bone.  While exercising or at rest. How is this diagnosed? This condition may be diagnosed based on:  A physical exam.  Your symptoms.  An observation of you while you are walking or running.  X-rays or other imaging tests. These may be done to rule out other problems. How is this treated? Treatment for this condition depends on your age, history, overall health, and how bad the pain is. Most cases of shin splints can be managed by doing one or more of the following:  Resting.  Reducing the length and intensity of your exercise.  Stopping the activity that causes shin pain.  Taking medicines to control the inflammation.  Icing, massaging, stretching, and strengthening the affected area.  Wearing shoes that have rigid heels, shock absorption, and a good arch support. For severe shin pain, your health care provider may recommend that you use crutches to avoid putting weight on your  legs. Follow these instructions at home: Medicines  Take over-the-counter and prescription medicines only as told by your health care provider.  Do not drive or use heavy machinery while taking prescription pain medicine. Managing pain, stiffness, and swelling      If directed, apply ice to the painful area. Icing can help to relieve pain and swelling. ? Put ice in a plastic bag. ? Place a towel between your skin and the bag. ? Leave the ice on for 20 minutes, 2-3 times a day.  If directed, apply heat to the painful area before stretching exercises, or as told by your health care provider. Heat can help to relax your muscles. Use the heat source that your health care provider recommends, such as a moist heat pack or a heating pad. ? Place a towel between your skin and the heat source. ? Leave the heat on for 20-30 minutes. ? Remove the heat if your skin turns bright red. This is especially important if you are unable to feel pain, heat, or cold. You may have a greater risk of getting burned.  Massage, stretch, and strengthen the affected area as directed by your health care provider.  Wear compression sleeves or socks as told by your health care provider.  Raise (elevate) your legs above the level of your heart while you are sitting or lying down. Activity  Rest as needed. Return to activity gradually as told by your health care provider.  When you start exercising again, begin with non-weight-bearing exercises,   such as cycling or swimming.  Stop running if the pain returns.  Warm up properly before exercising.  Run on a surface that is level and fairly firm.  Gradually change the intensity of an exercise.  If you increase your running distance, add only 5-10% to your distance each week. This means that if you are running 5 miles this week, you should only increase your run by - mile for next week. General instructions  Wear shoes that have rigid heels, shock absorption,  and a good arch support.  Change your athletic shoes every 6 months, or every 350-450 miles.  Keep all follow-up visits as told by your health care provider. This is important. Contact a health care provider if:  Your symptoms continue, or they worsen even after treatment.  The location, intensity, or type of pain changes over time.  You have swelling in your lower leg that gets worse.  Your shin becomes red and feels warm. Get help right away if:  You have severe pain.  You have trouble walking. Summary  Shin splints happens when physical activities lead to inflammation of the muscles, tendons, and the thin layer of tissue that covers the shin bone.  Treatments may include medicines, resting, and icing.  Return to activity gradually as directed by your health care provider.  Make sure you know what symptoms should cause you to contact your health care provider. This information is not intended to replace advice given to you by your health care provider. Make sure you discuss any questions you have with your health care provider. Document Revised: 08/29/2017 Document Reviewed: 08/29/2017 Elsevier Patient Education  2020 Elsevier Inc.  

## 2020-02-15 ENCOUNTER — Other Ambulatory Visit: Payer: Self-pay

## 2020-02-15 ENCOUNTER — Ambulatory Visit (INDEPENDENT_AMBULATORY_CARE_PROVIDER_SITE_OTHER): Payer: No Typology Code available for payment source

## 2020-02-15 DIAGNOSIS — Z3042 Encounter for surveillance of injectable contraceptive: Secondary | ICD-10-CM | POA: Diagnosis not present

## 2020-02-15 MED ORDER — MEDROXYPROGESTERONE ACETATE 150 MG/ML IM SUSP
150.0000 mg | Freq: Once | INTRAMUSCULAR | Status: AC
  Administered 2020-02-15: 150 mg via INTRAMUSCULAR

## 2020-02-15 NOTE — Progress Notes (Signed)
Patient presents today for Depo Provera injection within dates. Given IM LUOQ. Patient tolerated well. 

## 2020-03-31 ENCOUNTER — Encounter: Payer: Self-pay | Admitting: Physician Assistant

## 2020-03-31 ENCOUNTER — Ambulatory Visit (INDEPENDENT_AMBULATORY_CARE_PROVIDER_SITE_OTHER): Payer: No Typology Code available for payment source | Admitting: Physician Assistant

## 2020-03-31 ENCOUNTER — Other Ambulatory Visit: Payer: Self-pay

## 2020-03-31 VITALS — BP 99/67 | HR 61 | Temp 98.7°F | Ht 66.0 in | Wt 172.0 lb

## 2020-03-31 DIAGNOSIS — F411 Generalized anxiety disorder: Secondary | ICD-10-CM | POA: Diagnosis not present

## 2020-03-31 DIAGNOSIS — Z0000 Encounter for general adult medical examination without abnormal findings: Secondary | ICD-10-CM | POA: Diagnosis not present

## 2020-03-31 MED ORDER — HYDROXYZINE HCL 10 MG PO TABS: 10.0000 mg | ORAL_TABLET | Freq: Three times a day (TID) | ORAL | 1 refills | Status: AC | PRN

## 2020-03-31 MED ORDER — CITALOPRAM HYDROBROMIDE 40 MG PO TABS: 40.0000 mg | ORAL_TABLET | Freq: Every day | ORAL | 3 refills | Status: DC

## 2020-03-31 NOTE — Progress Notes (Signed)
Complete physical exam   Patient: Stefanie Fitzgerald   DOB: Jul 08, 1991   29 y.o. Female  MRN: [id number] Visit Date: 03/31/2020  Today's healthcare provider: Trinna Post, PA-C   Chief Complaint  Patient presents with  . Annual Exam   Subjective    Stefanie Fitzgerald is a 29 y.o. female who presents today for a complete physical exam.  She reports consuming a general diet. The patient does not participate in regular exercise at present. She generally feels well. She reports sleeping fairly well. She does not have additional problems to discuss today.   She is moving to [place] in November. She will keep her current position with [employer].   Anxiety, Follow-up  She was last seen for anxiety 1 years ago. Changes made at last visit include celexa 40 mg QD.   She reports excellent compliance with treatment. She reports excellent tolerance of treatment. She is not having side effects.   She feels her anxiety is moderate and Unchanged since last visit.  Symptoms: No chest pain No difficulty concentrating  No dizziness No fatigue  No feelings of losing control No insomnia  No irritable No palpitations  No panic attacks No racing thoughts  No shortness of breath No sweating  No tremors/shakes    GAD-7 Results GAD-7 Generalized Anxiety Disorder Screening Tool 03/01/2019 01/18/2019 12/18/2018  1. Feeling Nervous, Anxious, or on Edge _0 2. Not Being Able to Stop or Control Worrying _1 3. Worrying Too Much About Different Things _2 4. Trouble Relaxing 0 2 3  5. Being So Restless it's Hard To Sit Still _3 6. Becoming Easily Annoyed or Irritable _4 7. Feeling Afraid As If Something Awful Might Happen 0 1 1  Total GAD-7 Score _5 Difficulty At Work, Home, or Getting  Along With Others? Not difficult at all Very difficult Somewhat difficult    PHQ-9 Scores PHQ9 SCORE ONLY 03/31/2020 03/31/2020 04/18/2019  PHQ-9 Total Score 5 0  6    ---------------------------------------------------------------------------------------------------   Past Medical History:  Diagnosis Date  . Anxiety   . Stress 2018   and panic   Past Surgical History:  Procedure Laterality Date  . 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 TONSILLECTOMY  1996  . WISDOM TOOTH EXTRACTION     Social History   Socioeconomic History  . Marital status: Single    Spouse name: Not on file  . Number of children: 0  . Years of education: bachelor's  . Highest education level: Bachelor's degree (e.g., BA, AB, BS)  Occupational History    Employer: [employer]    Comment: FULL TIME  Tobacco Use  . Smoking status: Never Smoker  . Smokeless tobacco: Never Used  Vaping Use  . Vaping Use: Never used  Substance and Sexual Activity  . Alcohol use: Yes    Comment: 1-2 glasses of wine per month  . Drug use: No  . Sexual activity: Not Currently    Birth control/protection: Injection  Other Topics Concern  . Not on file  Social History Narrative  . Not on file   Social Determinants of Health   Financial Resource Strain:   . Difficulty of Paying Living Expenses:   Food Insecurity:   . Worried About Charity fundraiser in the Last Year:   . [place] in the Last Year:  Transportation Needs:   . Film/video editor (Medical):   Marland Kitchen Lack of Transportation (Non-Medical):   Physical Activity:   . Days of Exercise per Week:   . Minutes of Exercise per Session:   Stress:   . Feeling of Stress :   Social Connections:   . Frequency of Communication with Friends and Family:   . Frequency of Social Gatherings with Friends and Family:   . Attends Religious Services:   . Active Member of Clubs or Organizations:   . Attends Archivist Meetings:   Marland Kitchen Marital Status:   Intimate Partner Violence:   . Fear of Current or Ex-Partner:   . Emotionally Abused:   Marland Kitchen Physically Abused:   . Sexually Abused:    Family Status  Relation  Name Status  . Mother  Alive  . Father  Alive  . Sister  Alive  . Brother  Alive  . MGF  Deceased  . Neg Hx  (Not Specified)   Family History  Problem Relation Age of Onset  . Depression Mother   . Anxiety disorder Mother   . Healthy Father   . Anxiety disorder Sister   . Depression Brother   . Anxiety disorder Brother   . Lung cancer Maternal Grandfather        smoker  . Breast cancer Neg Hx   . Colon cancer Neg Hx    Allergies  Allergen Reactions  . Oxycodone Itching and Swelling    Patient Care Team: Virginia Crews, MD as PCP - General (Family Medicine)   Medications: Outpatient Medications Prior to Visit  Medication Sig  . medroxyPROGESTERone (DEPO-PROVERA) 150 MG/ML injection Inject 1 mL (150 mg total) into the muscle every 3 (three) months. Administered by [facility]  . meloxicam (MOBIC) 15 MG tablet Take 1 tablet (15 mg total) by mouth daily.  . [DISCONTINUED] citalopram (CELEXA) 40 MG tablet Take 1 tablet (40 mg total) by mouth daily.  . [DISCONTINUED] hydrOXYzine (ATARAX/VISTARIL) 10 MG tablet TAKE 1 TABLET BY MOUTH THREE TIMES A DAY AS NEEDED   No facility-administered medications prior to visit.    Review of Systems  Constitutional: Negative.   HENT: Negative.   Eyes: Negative.   Respiratory: Negative.   Cardiovascular: Negative.   Gastrointestinal: Negative.   Endocrine: Negative.   Genitourinary: Negative.   Musculoskeletal: Negative.   Skin: Negative.   Allergic/Immunologic: Negative.   Neurological: Negative.   Hematological: Negative.   Psychiatric/Behavioral: Negative.       Objective    BP 99/67 (BP Location: Left Arm)   Pulse 61   Temp 98.7 F (37.1 C)   Ht _0  (1.676 m)   Wt 172 lb (78 kg)   BMI 27.76 kg/m    Physical Exam Constitutional:      Appearance: Normal appearance. She is normal weight.  HENT:     Right Ear: Tympanic membrane normal.     Left Ear: Tympanic membrane normal.  Cardiovascular:     Rate  and Rhythm: Normal rate and regular rhythm.     Pulses: Normal pulses.     Heart sounds: Normal heart sounds.  Pulmonary:     Effort: Pulmonary effort is normal.     Breath sounds: Normal breath sounds.  Abdominal:     General: Bowel sounds are normal.  Musculoskeletal:        General: Normal range of motion.  Skin:    General: Skin is warm and dry.  Neurological:  Mental Status: She is oriented to person, place, and time. Mental status is at baseline.  Psychiatric:        Mood and Affect: Mood normal.        Behavior: Behavior normal.       Last depression screening scores PHQ 2/9 Scores 03/31/2020 03/31/2020 04/18/2019  PHQ - 2 Score 0 0 1  PHQ- 9 Score 5 - 6   Last fall risk screening Fall Risk  04/18/2019  Falls in the past year? 1  Number falls in past yr: 1  Injury with Fall? 1  Follow up -   Last Audit-C alcohol use screening Alcohol Use Disorder Test (AUDIT) 03/31/2020  1. How often do you have a drink containing alcohol? 2  2. How many drinks containing alcohol do you have on a typical day when you are drinking? 0  3. How often do you have six or more drinks on one occasion? 1  AUDIT-C Score 3  4. How often during the last year have you found that you were not able to stop drinking once you had started? 0  5. How often during the last year have you failed to do what was normally expected from you because of drinking? 0  6. How often during the last year have you needed a first drink in the morning to get yourself going after a heavy drinking session? 0  7. How often during the last year have you had a feeling of guilt of remorse after drinking? 0  8. How often during the last year have you been unable to remember what happened the night before because you had been drinking? 0  9. Have you or someone else been injured as a result of your drinking? 0  10. Has a relative or friend or a doctor or another health worker been concerned about your drinking or suggested you  cut down? 0  Alcohol Use Disorder Identification Test Final Score (AUDIT) 3  Alcohol Brief Interventions/Follow-up AUDIT Score <7 follow-up not indicated   A score of 3 or more in women, and 4 or more in men indicates increased risk for alcohol abuse, EXCEPT if all of the points are from question 1   No results found for any visits on 03/31/20.  Assessment & Plan    1. Annual physical exam Labs ordered for employer ([employer]) as below.  - SARS-CoV-2 Semi-Quantitative Total Antibody, Spike - Lipid panel - CBC with Differential/Platelet - Comprehensive metabolic panel - Hemoglobin A1c - Direct LDL  2. GAD (generalized anxiety disorder) Stable. Continue current meds. Will refill for 1 year. Patient is relocating to [place] and will find another provider in the meantime.  - citalopram (CELEXA) 40 MG tablet; Take 1 tablet (40 mg total) by mouth daily.  Dispense: 90 tablet; Refill: 3 - hydrOXYzine (ATARAX/VISTARIL) 10 MG tablet; Take 1 tablet (10 mg total) by mouth 3 (three) times daily as needed.  Dispense: 30 tablet; Refill: 1  Routine Health Maintenance and Physical Exam  Exercise Activities and Dietary recommendations Goals   None     Immunization History  Administered Date(s) Administered  . DTaP 10/26/1990, 10/18/1991, 12/21/1991, 07/08/1992, 03/23/1994  . Hepatitis B 06/02/2001, 07/07/2001  . HiB (PRP-OMP) 10/26/1990, 10/18/1991  . IPV 10/26/1990, 10/18/1991, 12/21/1991, 03/23/1994  . MMR 10/18/1991, 03/23/1994  . Moderna SARS-COVID-2 Vaccination 02/23/2020, 03/22/2020  . Tdap 11/23/2019    Health Maintenance  Topic Date Due  . Hepatitis C Screening  Never done  . INFLUENZA VACCINE  03/23/2020  . PAP SMEAR-Modifier  08/09/2021  . TETANUS/TDAP  11/22/2029  . COVID-19 Vaccine  Completed  . HIV Screening  Completed    Discussed health benefits of physical activity, and encouraged her to engage in regular exercise appropriate for her age and condition.   No  follow-ups on file.     ITrinna Post, PA-C, have reviewed all documentation for this visit. The documentation on 04/01/20 for the exam, diagnosis, procedures, and orders are all accurate and complete.    Paulene Floor  [facility] [phone] (phone) [phone] (fax)  [place]

## 2020-03-31 NOTE — Telephone Encounter (Signed)
Patient called in and was advised that bloodwork will be done at [facility] today.

## 2020-04-02 LAB — CBC WITH DIFFERENTIAL/PLATELET
Basophils Absolute: 0.1 10*3/uL (ref 0.0–0.2)
Basos: 1 %
EOS (ABSOLUTE): 0.3 10*3/uL (ref 0.0–0.4)
Eos: 3 %
Hematocrit: 40.2 % (ref 34.0–46.6)
Hemoglobin: 12.9 g/dL (ref 11.1–15.9)
Immature Grans (Abs): 0 10*3/uL (ref 0.0–0.1)
Immature Granulocytes: 0 %
Lymphocytes Absolute: 3 10*3/uL (ref 0.7–3.1)
Lymphs: 32 %
MCH: 28.4 pg (ref 26.6–33.0)
MCHC: 32.1 g/dL (ref 31.5–35.7)
MCV: 89 fL (ref 79–97)
Monocytes Absolute: 0.6 10*3/uL (ref 0.1–0.9)
Monocytes: 7 %
Neutrophils Absolute: 5.5 10*3/uL (ref 1.4–7.0)
Neutrophils: 57 %
Platelets: 253 10*3/uL (ref 150–450)
RBC: 4.54 x10E6/uL (ref 3.77–5.28)
RDW: 12.5 % (ref 11.7–15.4)
WBC: 9.4 10*3/uL (ref 3.4–10.8)

## 2020-04-02 LAB — LDL CHOLESTEROL, DIRECT: LDL Direct: 99 mg/dL (ref 0–99)

## 2020-04-02 LAB — LIPID PANEL
Chol/HDL Ratio: 3.6 ratio (ref 0.0–4.4)
Cholesterol, Total: 164 mg/dL (ref 100–199)
HDL: 46 mg/dL (ref >39)
LDL Chol Calc (NIH): 105 mg/dL — ABNORMAL HIGH (ref 0–99)
Triglycerides: 69 mg/dL (ref 0–149)
VLDL Cholesterol Cal: 13 mg/dL (ref 5–40)

## 2020-04-02 LAB — HEMOGLOBIN A1C
Est. average glucose Bld gHb Est-mCnc: 114 mg/dL
Hgb A1c MFr Bld: 5.6 % (ref 4.8–5.6)

## 2020-04-02 LAB — COMPREHENSIVE METABOLIC PANEL
ALT: 19 IU/L (ref 0–32)
AST: 19 IU/L (ref 0–40)
Albumin/Globulin Ratio: 1.7 (ref 1.2–2.2)
Albumin: 4.7 g/dL (ref 3.9–5.0)
Alkaline Phosphatase: 60 IU/L (ref 48–121)
BUN/Creatinine Ratio: 19 (ref 9–23)
BUN: 15 mg/dL (ref 6–20)
Bilirubin Total: 0.7 mg/dL (ref 0.0–1.2)
CO2: 22 mmol/L (ref 20–29)
Calcium: 9.6 mg/dL (ref 8.7–10.2)
Chloride: 103 mmol/L (ref 96–106)
Creatinine, Ser: 0.78 mg/dL (ref 0.57–1.00)
GFR calc Af Amer: 118 mL/min/{1.73_m2} (ref >59)
GFR calc non Af Amer: 102 mL/min/{1.73_m2} (ref >59)
Globulin, Total: 2.7 g/dL (ref 1.5–4.5)
Glucose: 83 mg/dL (ref 65–99)
Potassium: 4.1 mmol/L (ref 3.5–5.2)
Sodium: 139 mmol/L (ref 134–144)
Total Protein: 7.4 g/dL (ref 6.0–8.5)

## 2020-04-02 LAB — SARS-COV-2 SEMI-QUANTITATIVE TOTAL ANTIBODY, SPIKE
SARS-CoV-2 Semi-Quant Total Ab: >2500 U/mL (ref <0.8)
SARS-CoV-2 Spike Ab Interp: POSITIVE

## 2020-04-05 ENCOUNTER — Encounter: Payer: Self-pay | Admitting: Obstetrics and Gynecology

## 2020-04-08 ENCOUNTER — Encounter: Payer: Self-pay | Admitting: Obstetrics and Gynecology

## 2020-04-08 ENCOUNTER — Ambulatory Visit (INDEPENDENT_AMBULATORY_CARE_PROVIDER_SITE_OTHER): Payer: No Typology Code available for payment source | Admitting: Obstetrics and Gynecology

## 2020-04-08 ENCOUNTER — Other Ambulatory Visit: Payer: Self-pay

## 2020-04-08 VITALS — BP 90/60 | Ht 66.0 in | Wt 171.0 lb

## 2020-04-08 DIAGNOSIS — N6310 Unspecified lump in the right breast, unspecified quadrant: Secondary | ICD-10-CM

## 2020-04-08 DIAGNOSIS — N644 Mastodynia: Secondary | ICD-10-CM

## 2020-04-08 NOTE — Progress Notes (Signed)
Erasmo Downer, MD   Chief Complaint  Patient presents with  . Breast Exam    pain in LB and lump felt on RB    HPI:      [name] is a 29 y.o. G0P0000 whose LMP was No LMP recorded. Patient has had an injection., presents today for a RT breast mass for a few days. Felt it on SBE, and it was slightly tender. Can't feel it anymore. 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 Hx of occas pains LT breast, particularly with rainy days, but has recently noted sharp pains, some constant, some fleeting , for the past wk in that area. Sx a little better today. Has cut down on caffeine use. No trauma, erythema, nipple d/c. Sometimes notes yellowish crusting on LT nipple at end of day. No FH breast/ovar ca. Currently on depo.    Past Medical History:  Diagnosis Date  . Anxiety   . Stress 2018   and panic    Past Surgical History:  Procedure Laterality Date  . 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 TONSILLECTOMY  1996  . WISDOM TOOTH EXTRACTION      Family History  Problem Relation Age of Onset  . Depression Mother   . Anxiety disorder Mother   . Healthy Father   . Anxiety disorder Sister   . Depression Brother   . Anxiety disorder Brother   . Lung cancer Maternal Grandfather        smoker  . Breast cancer Neg Hx   . Colon cancer Neg Hx     Social History   Socioeconomic History  . Marital status: Single    Spouse name: Not on file  . Number of children: 0  . Years of education: bachelor's  . Highest education level: Bachelor's degree (e.g., BA, AB, BS)  Occupational History    Employer: [employer]    Comment: FULL TIME  Tobacco Use  . Smoking status: Never Smoker  . Smokeless tobacco: Never Used  Vaping Use  . Vaping Use: Never used  Substance and Sexual Activity  . Alcohol use: Yes    Comment: 1-2 glasses of wine per month  . Drug  use: No  . Sexual activity: Not Currently    Birth control/protection: Injection  Other Topics Concern  . Not on file  Social History Narrative  . Not on file   Social Determinants of Health   Financial Resource Strain:   . Difficulty of Paying Living Expenses:   Food Insecurity:   . Worried About Programme researcher, broadcasting/film/video in the Last Year:   . Barista in the Last Year:   Transportation Needs:   . Freight forwarder (Medical):   Marland Kitchen Lack of Transportation (Non-Medical):   Physical Activity:   . Days of Exercise per Week:   . Minutes of Exercise per Session:   Stress:   . Feeling of Stress :   Social Connections:   . Frequency of Communication with Friends and Family:   . Frequency of Social Gatherings with Friends and Family:   . Attends Religious Services:   . Active Member of Clubs or Organizations:   . Attends Banker Meetings:   Marland Kitchen Marital Status:   Intimate Partner Violence:   . Fear of Current or Ex-Partner:   . Emotionally Abused:   .  Physically Abused:   . Sexually Abused:     Outpatient Medications Prior to Visit  Medication Sig Dispense Refill  . citalopram (CELEXA) 20 MG tablet Take 20 mg by mouth daily.    . hydrOXYzine (ATARAX/VISTARIL) 10 MG tablet Take 1 tablet (10 mg total) by mouth 3 (three) times daily as needed. 30 tablet 1  . medroxyPROGESTERone (DEPO-PROVERA) 150 MG/ML injection Inject 1 mL (150 mg total) into the muscle every 3 (three) months. Administered by [facility] 1 mL 3  . meloxicam (MOBIC) 15 MG tablet Take 1 tablet (15 mg total) by mouth daily. 30 tablet 0  . citalopram (CELEXA) 40 MG tablet Take by mouth. (Patient not taking: Reported on 04/08/2020)    . citalopram (CELEXA) 40 MG tablet Take 1 tablet (40 mg total) by mouth daily. 90 tablet 3   No facility-administered medications prior to visit.      ROS:  Review of Systems  Constitutional: Negative for fever.  Gastrointestinal: Negative for blood in stool,  constipation, diarrhea, nausea and vomiting.  Genitourinary: Negative for dyspareunia, dysuria, flank pain, frequency, hematuria, urgency, vaginal bleeding, vaginal discharge and vaginal pain.  Musculoskeletal: Negative for back pain.  Skin: Negative for rash.   BREAST: pain/mass   OBJECTIVE:   Vitals:  BP 90/60   Ht 5\' 6"  (1.676 m)   Wt 171 lb (77.6 kg)   BMI 27.60 kg/m   Physical Exam Vitals reviewed.  Pulmonary:     Effort: Pulmonary effort is normal.  Chest:     Breasts: Breasts are symmetrical.        Right: No inverted nipple, mass, nipple discharge, skin change or tenderness.        Left: Tenderness present. No inverted nipple, mass, nipple discharge or skin change.    Musculoskeletal:        General: Normal range of motion.     Cervical back: Normal range of motion.  Skin:    General: Skin is warm and dry.  Neurological:     General: No focal deficit present.     Mental Status: She is alert and oriented to person, place, and time.     Cranial Nerves: No cranial nerve deficit.  Psychiatric:        Mood and Affect: Mood normal.        Behavior: Behavior normal.        Thought Content: Thought content normal.        Judgment: Judgment normal.    Assessment/Plan: Breast mass, right - Plan: MM DIAG BREAST TOMO BILATERAL, BREAST LTD UNI LEFT INC AXILLA, US BREAST LTD UNI RIGHT INC AXILLA; Resolved but checking dx mammo anyway.  Breast pain, left - Plan: MM DIAG BREAST TOMO BILATERAL, US BREAST LTD UNI LEFT INC AXILLA, US BREAST LTD UNI RIGHT INC AXILLA; no masses but very tender. Check Dx mammo and u/s. Will f/u with results. Sx improved today.     Return if symptoms worsen or fail to improve.  Willi Borowiak B. Shayon Trompeter, PA-C 04/08/2020 4:58 PM

## 2020-04-08 NOTE — Patient Instructions (Signed)
I value your feedback and entrusting us with your care. If you get a [facility] patient survey, I would appreciate you taking the time to let us know about your experience today. Thank you!  As of August 02, 2019, your lab results will be released to your MyChart immediately, before I even have a chance to see them. Please give me time to review them and contact you if there are any abnormalities. Thank you for your patience.  

## 2020-04-10 ENCOUNTER — Encounter: Payer: Self-pay | Admitting: Obstetrics and Gynecology

## 2020-04-17 ENCOUNTER — Ambulatory Visit
Admission: RE | Admit: 2020-04-17 | Discharge: 2020-04-17 | Disposition: A | Discharge status: Home / Self Care | Payer: No Typology Code available for payment source | Source: Ambulatory Visit | Attending: Obstetrics and Gynecology | Admitting: Obstetrics and Gynecology

## 2020-04-17 ENCOUNTER — Other Ambulatory Visit: Payer: Self-pay

## 2020-04-17 DIAGNOSIS — N6310 Unspecified lump in the right breast, unspecified quadrant: Secondary | ICD-10-CM

## 2020-04-17 DIAGNOSIS — N644 Mastodynia: Secondary | ICD-10-CM

## 2020-04-18 ENCOUNTER — Encounter: Payer: 59 | Admitting: Family Medicine

## 2020-05-05 ENCOUNTER — Telehealth: Payer: Self-pay

## 2020-05-05 DIAGNOSIS — F321 Major depressive disorder, single episode, moderate: Secondary | ICD-10-CM

## 2020-05-05 DIAGNOSIS — F411 Generalized anxiety disorder: Secondary | ICD-10-CM

## 2020-05-05 NOTE — Telephone Encounter (Signed)
Ok to place referral.

## 2020-05-05 NOTE — Telephone Encounter (Signed)
Copied from CRM [id number]. Topic: Referral - Request for Referral >> May 05, 2020  2:56 PM Crist Infante wrote: Pt needs new referral to [facility]. Pt last seen there 2018 and was told needs new referral. Pt has a lot going on and feels she needs to talk with someone.

## 2020-05-08 ENCOUNTER — Ambulatory Visit (INDEPENDENT_AMBULATORY_CARE_PROVIDER_SITE_OTHER): Payer: No Typology Code available for payment source

## 2020-05-08 ENCOUNTER — Other Ambulatory Visit: Payer: Self-pay

## 2020-05-08 DIAGNOSIS — Z3042 Encounter for surveillance of injectable contraceptive: Secondary | ICD-10-CM

## 2020-05-08 MED ORDER — MEDROXYPROGESTERONE ACETATE 150 MG/ML IM SUSP
150.0000 mg | Freq: Once | INTRAMUSCULAR | Status: AC
  Administered 2020-05-08: 150 mg via INTRAMUSCULAR

## 2020-05-08 NOTE — Progress Notes (Signed)
Patient presents today for Depo Provera injection within dates. Given IM RUOQ. Patient tolerated well. 

## 2020-05-09 ENCOUNTER — Ambulatory Visit: Payer: No Typology Code available for payment source

## 2020-05-13 ENCOUNTER — Other Ambulatory Visit: Payer: Self-pay

## 2020-05-13 ENCOUNTER — Encounter: Payer: Self-pay | Admitting: Family Medicine

## 2020-05-13 ENCOUNTER — Ambulatory Visit: Payer: No Typology Code available for payment source | Admitting: Family Medicine

## 2020-05-13 VITALS — BP 106/67 | HR 63 | Temp 98.9°F | Resp 16 | Wt 173.4 lb

## 2020-05-13 DIAGNOSIS — F411 Generalized anxiety disorder: Secondary | ICD-10-CM

## 2020-05-13 DIAGNOSIS — F331 Major depressive disorder, recurrent, moderate: Secondary | ICD-10-CM | POA: Diagnosis not present

## 2020-05-13 MED ORDER — CITALOPRAM HYDROBROMIDE 40 MG PO TABS: 40.0000 mg | ORAL_TABLET | Freq: Every day | ORAL | 1 refills | Status: DC

## 2020-05-13 MED ORDER — TRAZODONE HCL 50 MG PO TABS: 25.0000 mg | ORAL_TABLET | Freq: Every evening | ORAL | 3 refills | Status: DC | PRN

## 2020-05-13 NOTE — Assessment & Plan Note (Signed)
Chronic and uncontrolled Worsened in the setting of recent loss of plans to move to [place] Continue celexa 40mg  daily Add trazodone 25-50 mg qhs prn for insomnia Encourage therapy Discussed mindfulness and 3 good things Tasked to do one hygeine item daily  Tasked with setting alarm to take medication regularly

## 2020-05-13 NOTE — Assessment & Plan Note (Signed)
Chronic and uncontrolled Worsened in the setting of recent loss of plans to move to [place] Continue celexa 40mg  daily Add trazodone 25-50 mg qhs prn for insomnia Encourage therapy Discussed mindfulness and 3 good things Tasked to do one hygeine item daily  Tasked with setting alarm to take medication regularly Contracted for safety - no SI/HI

## 2020-05-13 NOTE — Progress Notes (Signed)
Established patient visit   Patient: Stefanie Fitzgerald   DOB: [birth year]   29 y.o. Female  MRN: [id number] Visit Date: 05/13/2020  Today's healthcare provider: Shirlee Latch, MD   Chief Complaint  Patient presents with  . Anxiety   Subjective    HPI  Anxiety, Follow-up  She was last seen for anxiety 1 months ago. Changes made at last visit include none, condition was stable.   She reports poor compliance with treatment.Patient states that she forgets to take her medicine She reports good tolerance of treatment. She is not having side effects.   She feels her anxiety is severe and Worse since last visit.   Was planning move to [place] for several years and everything seems to have fallen through so quickly.  She is grieving this los of a dream.  She has lost concentration, motivation, has anhedonia and increased anxiety.  Feels like she is using unhealthy coping skills, such as spending money.  Symptoms: Yes chest pain Yes difficulty concentrating  Yes dizziness Yes fatigue  Yes feelings of losing control Yes insomnia  Yes irritable No palpitations  Yes panic attacks Yes racing thoughts  Yes shortness of breath Yes sweating  No tremors/shakes    GAD-7 Results GAD-7 Generalized Anxiety Disorder Screening Tool 05/13/2020 03/01/2019 01/18/2019  1. Feeling Nervous, Anxious, or on Edge 1 1 3   2. Not Being Able to Stop or Control Worrying 1 1 2   3. Worrying Too Much About Different Things 1 1 2   4. Trouble Relaxing 2 0 2  5. Being So Restless it's Hard To Sit Still 3 1 3   6. Becoming Easily Annoyed or Irritable 3 1 2   7. Feeling Afraid As If Something Awful Might Happen 0 0 1  Total GAD-7 Score 11 5 15   Difficulty At Work, Home, or Getting  Along With Others? Somewhat difficult Not difficult at all Very difficult    PHQ-9 Scores PHQ9 SCORE ONLY 05/13/2020 03/31/2020 03/31/2020  PHQ-9 Total Score 20 5 0     ---------------------------------------------------------------------------------------------------  Patient Active Problem List   Diagnosis Date Noted  . Overweight 04/18/2019  . Palpitations 12/18/2018  . Dizziness 09/07/2017  . GAD (generalized anxiety disorder) 05/09/2017  . MDD (major depressive disorder) 05/09/2017   Past Medical History:  Diagnosis Date  . Anxiety   . Stress 2018   and panic   Social History   Tobacco Use  . Smoking status: Never Smoker  . Smokeless tobacco: Never Used  Vaping Use  . Vaping Use: Never used  Substance Use Topics  . Alcohol use: Yes    Comment: 1-2 glasses of wine per month  . Drug use: No   Allergies  Allergen Reactions  . Oxycodone Itching, Swelling, Other (See Comments) and Hives       Medications: Outpatient Medications Prior to Visit  Medication Sig  . hydrOXYzine (ATARAX/VISTARIL) 10 MG tablet Take 1 tablet (10 mg total) by mouth 3 (three) times daily as needed.  . medroxyPROGESTERone (DEPO-PROVERA) 150 MG/ML injection Inject 1 mL (150 mg total) into the muscle every 3 (three) months. Administered by [facility]  . meloxicam (MOBIC) 15 MG tablet Take 1 tablet (15 mg total) by mouth daily.  . [DISCONTINUED] citalopram (CELEXA) 20 MG tablet Take 20 mg by mouth daily.  . [DISCONTINUED] citalopram (CELEXA) 40 MG tablet Take by mouth. (Patient not taking: Reported on 04/08/2020)   No facility-administered medications prior to visit.    Review of Systems  Constitutional: Positive for activity change. Negative for appetite change, chills, diaphoresis, fatigue, fever and unexpected weight change.  HENT: Negative.   Respiratory: Negative.   Cardiovascular: Negative.   Gastrointestinal: Negative.   Skin: Negative.   Neurological: Negative.   Hematological: Negative.   Psychiatric/Behavioral: Positive for decreased concentration, dysphoric mood and sleep disturbance. Negative for confusion and suicidal ideas. The  patient is nervous/anxious and is hyperactive.       Objective    BP 106/67   Pulse 63   Temp 98.9 F (37.2 C) (Oral)   Resp 16   Wt 173 lb 6.4 oz (78.7 kg)   SpO2 99%   BMI 27.99 kg/m    Physical Exam Vitals reviewed.  Constitutional:      General: She is not in acute distress.    Appearance: Normal appearance. She is well-developed.  HENT:     Head: Normocephalic and atraumatic.  Eyes:     General: No scleral icterus.    Conjunctiva/sclera: Conjunctivae normal.  Cardiovascular:     Rate and Rhythm: Normal rate and regular rhythm.  Pulmonary:     Effort: Pulmonary effort is normal. No respiratory distress.  Skin:    General: Skin is warm and dry.     Findings: No rash.  Neurological:     Mental Status: She is alert and oriented to person, place, and time.  Psychiatric:        Mood and Affect: Mood is anxious and depressed. Affect is tearful.        Speech: Speech normal.        Behavior: Behavior normal.        Thought Content: Thought content normal. Thought content does not include homicidal or suicidal ideation.       No results found for any visits on 05/13/20.  Assessment & Plan     Problem List Items Addressed This Visit      Other   GAD (generalized anxiety disorder)    Chronic and uncontrolled Worsened in the setting of recent loss of plans to move to [place] Continue celexa 40mg  daily Add trazodone 25-50 mg qhs prn for insomnia Encourage therapy Discussed mindfulness and 3 good things Tasked to do one hygeine item daily  Tasked with setting alarm to take medication regularly      Relevant Medications   traZODone (DESYREL) 50 MG tablet   citalopram (CELEXA) 40 MG tablet   Other Relevant Orders   Ambulatory referral to Psychology   MDD (major depressive disorder) - Primary    Chronic and uncontrolled Worsened in the setting of recent loss of plans to move to Continue celexa 40mg  daily Add trazodone 25-50 mg qhs prn for insomnia Encourage  therapy Discussed mindfulness and 3 good things Tasked to do one hygeine item daily  Tasked with setting alarm to take medication regularly Contracted for safety - no SI/HI      Relevant Medications   traZODone (DESYREL) 50 MG tablet   citalopram (CELEXA) 40 MG tablet   Other Relevant Orders   Ambulatory referral to Psychology       Return in about 4 weeks (around 06/10/2020) for MDD/GAD f/u, virtual ok.      I, , MD, have reviewed all documentation for this visit. The documentation on 05/13/20 for the exam, diagnosis, procedures, and orders are all accurate and complete.   Zed Wanninger, Shirlee Latch, MD, MPH [facility]

## 2020-05-23 ENCOUNTER — Encounter: Payer: Self-pay | Admitting: Family Medicine

## 2020-05-27 NOTE — Telephone Encounter (Signed)
Hi [name] do we have an update on her psychology referral?

## 2020-06-02 NOTE — Telephone Encounter (Signed)
I forwarded the declined referral this morning.  Can we send it elsewhere?  Apparently Dr Elna Breslow isnt taking patients anymore.  Maybe we could refer to psychology in the meantime while waiting on psychiatry?

## 2020-06-06 ENCOUNTER — Other Ambulatory Visit: Payer: Self-pay | Admitting: Family Medicine

## 2020-06-06 NOTE — Telephone Encounter (Signed)
Requested Prescriptions  Pending Prescriptions Disp Refills  . traZODone (DESYREL) 50 MG tablet [Pharmacy Med Name: TRAZODONE 50 MG TABLET] 90 tablet 1    Sig: TAKE 0.5-1 TABLETS (25-50 MG TOTAL) BY MOUTH AT BEDTIME AS NEEDED FOR SLEEP.     Psychiatry: Antidepressants - Serotonin Modulator Passed - 06/06/2020 11:31 AM      Passed - Completed PHQ-2 or PHQ-9 in the last 360 days.      Passed - Valid encounter within last 6 months    Recent Outpatient Visits          3 weeks ago Moderate episode of recurrent major depressive disorder [facility])   [facility] [place], Marzella Schlein, MD   2 months ago Annual physical exam   [facility] Osvaldo Angst M, PA-C   6 months ago Acute pain of left knee   [facility] [place], Marzella Schlein, MD   1 year ago Encounter for annual physical exam   [facility], [facility]zella [facility], [facility]   1 year ago GAD (generalized anxiety disorder)   [facility] Bacigalupo, Marzella Schlein, MD      Future Appointments            In 6 days Bacigalupo, Marzella Schlein, MD [facility]           Pharmacy requesting 90 day's

## 2020-06-09 ENCOUNTER — Other Ambulatory Visit: Payer: Self-pay

## 2020-06-09 ENCOUNTER — Ambulatory Visit (INDEPENDENT_AMBULATORY_CARE_PROVIDER_SITE_OTHER): Payer: No Typology Code available for payment source | Admitting: Licensed Clinical Social Worker

## 2020-06-09 ENCOUNTER — Encounter: Payer: Self-pay | Admitting: Licensed Clinical Social Worker

## 2020-06-09 DIAGNOSIS — F411 Generalized anxiety disorder: Secondary | ICD-10-CM | POA: Diagnosis not present

## 2020-06-09 DIAGNOSIS — F331 Major depressive disorder, recurrent, moderate: Secondary | ICD-10-CM
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
## 2020-06-12 ENCOUNTER — Encounter: Payer: Self-pay | Admitting: Family Medicine

## 2020-06-12 ENCOUNTER — Telehealth: Payer: No Typology Code available for payment source | Admitting: Family Medicine

## 2020-06-12 DIAGNOSIS — F5104 Psychophysiologic insomnia: Secondary | ICD-10-CM

## 2020-06-12 DIAGNOSIS — F331 Major depressive disorder, recurrent, moderate: Secondary | ICD-10-CM

## 2020-06-12 DIAGNOSIS — F411 Generalized anxiety disorder: Secondary | ICD-10-CM

## 2020-06-12 NOTE — Patient Instructions (Signed)
Try trazodone 100mg  nightly for sleep

## 2020-06-12 NOTE — Assessment & Plan Note (Signed)
Chronic and uncontrolled Now improving Now taking medications more regularly Continue Celexa 40 mg daily Trazodone as below for insomnia Continue therapy Contracted for safety-no SI/HI Follow-up in 6 to 8 weeks and repeat GAD-7 and PHQ-9

## 2020-06-12 NOTE — Assessment & Plan Note (Signed)
Chronic and uncontrolled Starting to improve Now taking medication more regularly Continue Celexa 40 mg daily Trazodone as below for insomnia Continue therapy Follow-up in 6 weeks and repeat GAD-7 and PHQ-9

## 2020-06-12 NOTE — Assessment & Plan Note (Signed)
Associated with anxiety and depression as above Some improvement with trazodone, but still waking in the middle of the night Increase trazodone to 100 mg nightly as needed and see if this improves further Advised on CBT-I and sleep hygiene

## 2020-06-12 NOTE — Progress Notes (Signed)
MyChart Video Visit    Virtual Visit via Video Note   This visit type was conducted due to national recommendations for restrictions regarding the COVID-19 Pandemic (e.g. social distancing) in an effort to limit this patient's exposure and mitigate transmission in our community. This patient is at least at moderate risk for complications without adequate follow up. This format is felt to be most appropriate for this patient at this time. Physical exam was limited by quality of the video and audio technology used for the visit.    Patient location: home Provider location: [facility] Persons involved in the visit: patient, provider  I discussed the limitations of evaluation and management by telemedicine and the availability of in person appointments. The patient expressed understanding and agreed to proceed.   Patient: Stefanie Fitzgerald   DOB: Mar 14, 1991   29 y.o. Female  MRN: [id number] Visit Date: 06/12/2020  Today's healthcare provider: Shirlee Latch, MD   Chief Complaint  Patient presents with  . Depression  . Anxiety   Subjective    HPI  Anxiety, Follow-up  She was last seen for anxiety 4 months ago. Changes made at last visit include Celexa 40 mg daily - taking it more regularly - doing better.  Started trazodone prn for sleeps - helps with falling asleep, but still wakes during the night   She reports excellent compliance with treatment. She reports excellent tolerance of treatment. She is not having side effects.   She feels her anxiety is moderate and Improved since last visit.  Started therapy on Monday   GAD-7 Results GAD-7 Generalized Anxiety Disorder Screening Tool 06/12/2020 05/13/2020 03/01/2019  1. Feeling Nervous, Anxious, or on Edge 1 1 1   2. Not Being Able to Stop or Control Worrying 2 1 1   3. Worrying Too Much About Different Things 2 1 1   4. Trouble Relaxing 1 2 0  5. Being So Restless it's Hard To Sit Still 1 3 1    6. Becoming Easily Annoyed or Irritable 1 3 1   7. Feeling Afraid As If Something Awful Might Happen 0 0 0  Total GAD-7 Score 8 11 5   Difficulty At Work, Home, or Getting  Along With Others? Somewhat difficult Somewhat difficult Not difficult at all    Depression screen [facility] 2/9 06/12/2020 05/13/2020 03/31/2020 03/31/2020 04/18/2019  Decreased Interest 1 3 0 0 1  Down, Depressed, Hopeless 1 3 0 0 0  PHQ - 2 Score 2 6 0 0 1  Altered sleeping 3 3 2  - 1  Tired, decreased energy 2 3 1  - 1  Change in appetite 3 0 1 - 2  Feeling bad or failure about yourself  1 3 0 - 0  Trouble concentrating 3 3 0 - 1  Moving slowly or fidgety/restless 0 2 1 - 0  Suicidal thoughts 0 0 0 - 0  PHQ-9 Score 14 20 5  - 6  Difficult doing work/chores Very difficult Very difficult Not difficult at all - Somewhat difficult  Some recent data might be hidden    ---------------------------------------------------------------------------------------------------   Patient Active Problem List   Diagnosis Date Noted  . Psychophysiologic insomnia 06/12/2020  . Overweight 04/18/2019  . Palpitations 12/18/2018  . Dizziness 09/07/2017  . GAD (generalized anxiety disorder) 05/09/2017  . MDD (major depressive disorder) 05/09/2017   Past Medical History:  Diagnosis Date  . Anxiety   . Stress 2018   and panic   Social History   Tobacco Use  . Smoking status:  Never Smoker  . Smokeless tobacco: Never Used  Vaping Use  . Vaping Use: Never used  Substance Use Topics  . Alcohol use: Yes    Comment: 1-2 glasses of wine per month  . Drug use: No   Allergies  Allergen Reactions  . Oxycodone Itching, Swelling, Other (See Comments) and Hives    Medications: Outpatient Medications Prior to Visit  Medication Sig  . citalopram (CELEXA) 40 MG tablet Take 1 tablet (40 mg total) by mouth daily.  . hydrOXYzine (ATARAX/VISTARIL) 10 MG tablet Take 1 tablet (10 mg total) by mouth 3 (three) times daily as needed.  .  medroxyPROGESTERone (DEPO-PROVERA) 150 MG/ML injection Inject 1 mL (150 mg total) into the muscle every 3 (three) months. Administered by [facility]  . meloxicam (MOBIC) 15 MG tablet Take 1 tablet (15 mg total) by mouth daily.  . traZODone (DESYREL) 50 MG tablet TAKE 0.5-1 TABLETS (25-50 MG TOTAL) BY MOUTH AT BEDTIME AS NEEDED FOR SLEEP.   No facility-administered medications prior to visit.    Review of Systems  Constitutional: Negative.   Respiratory: Negative.   Cardiovascular: Negative.   Gastrointestinal: Negative.   Neurological: Negative for dizziness, light-headedness and headaches.  Psychiatric/Behavioral: Positive for decreased concentration and sleep disturbance. Negative for dysphoric mood, self-injury and suicidal ideas. The patient is nervous/anxious.       Objective    There were no vitals taken for this visit.   Physical Exam Constitutional:      General: She is not in acute distress.    Appearance: Normal appearance. She is not diaphoretic.  Pulmonary:     Effort: Pulmonary effort is normal. No respiratory distress.  Neurological:     Mental Status: She is alert and oriented to person, place, and time. Mental status is at baseline.  Psychiatric:        Mood and Affect: Mood normal.        Behavior: Behavior normal.        Thought Content: Thought content normal.        Assessment & Plan     Problem List Items Addressed This Visit      Other   GAD (generalized anxiety disorder) - Primary    Chronic and uncontrolled Starting to improve Now taking medication more regularly Continue Celexa 40 mg daily Trazodone as below for insomnia Continue therapy Follow-up in 6 weeks and repeat GAD-7 and PHQ-9      MDD (major depressive disorder)    Chronic and uncontrolled Now improving Now taking medications more regularly Continue Celexa 40 mg daily Trazodone as below for insomnia Continue therapy Contracted for safety-no SI/HI Follow-up in 6 to 8  weeks and repeat GAD-7 and PHQ-9      Psychophysiologic insomnia    Associated with anxiety and depression as above Some improvement with trazodone, but still waking in the middle of the night Increase trazodone to 100 mg nightly as needed and see if this improves further Advised on CBT-I and sleep hygiene          Return in about 6 weeks (around 07/24/2020) for MDD/GAD f/u.     I discussed the assessment and treatment plan with the patient. The patient was provided an opportunity to ask questions and all were answered. The patient agreed with the plan and demonstrated an understanding of the instructions.   The patient was advised to call back or seek an in-person evaluation if the symptoms worsen or if the condition fails to improve as  anticipated.  I, Shirlee Latch, MD, have reviewed all documentation for this visit. The documentation on 06/12/20 for the exam, diagnosis, procedures, and orders are all accurate and complete.   Catherina Pates, Marzella Schlein, MD, MPH [facility]

## 2020-06-15 ENCOUNTER — Other Ambulatory Visit: Payer: Self-pay | Admitting: Family Medicine

## 2020-06-25 ENCOUNTER — Other Ambulatory Visit: Payer: Self-pay

## 2020-06-25 ENCOUNTER — Ambulatory Visit (INDEPENDENT_AMBULATORY_CARE_PROVIDER_SITE_OTHER): Payer: No Typology Code available for payment source | Admitting: Licensed Clinical Social Worker

## 2020-06-25 ENCOUNTER — Encounter: Payer: Self-pay | Admitting: Licensed Clinical Social Worker

## 2020-06-25 DIAGNOSIS — F411 Generalized anxiety disorder: Secondary | ICD-10-CM | POA: Diagnosis not present

## 2020-06-25 DIAGNOSIS — F331 Major depressive disorder, recurrent, moderate: Secondary | ICD-10-CM

## 2020-06-25 NOTE — Progress Notes (Signed)
Virtual Visit via Video Note  I connected with Stefanie Fitzgerald on 06/25/20 at  3:00 PM EDT by a video enabled telemedicine application and verified that I am speaking with the correct person using two identifiers.  Location: Patient: Home Provider: Home Office   I discussed the limitations of evaluation and management by telemedicine and the availability of in person appointments. The patient expressed understanding and agreed to proceed.  THERAPY PROGRESS NOTE  Session Time: 69 Minutes  Participation Level: Active  Behavioral Response: Well GroomedAlertAnxious  Type of Therapy: Individual Therapy  Treatment Goals addressed: Anxiety and Coping  Interventions: CBT  Summary: Stefanie Fitzgerald is a 30 y.o. female who presents with depression and anxiety sxs. Pt reported she is having difficulty in planning current phase of life after plans to move out of state fell through. Pt identified how she came to feel stuck and family dynamics that have contributed to her sense of purpose and values as well as anxiety sxs. Pt described close, but often complicated relationship with father. Pt reported feeling stifled when she was younger and not able to make her own decisions about college and what to do with her life due to appeasing father's wishes.   Suicidal/Homicidal: No  Therapist Response: Therapist met with patient for first session since completing CCA. Therapist and patient reviewed treatment plan and goals. Pt in agreement. Therapist and patient explored current stressors and attempts to cope as well as familial factors that led to current state of anxiety.  Plan: Return again in 2 weeks.  Diagnosis: Axis I: Generalized Anxiety Disorder and MDD, Recurrent, Moderate    Axis II: N/A  Josephine Igo, LCSW, LCAS 06/25/2020

## 2020-07-11 ENCOUNTER — Encounter: Payer: Self-pay | Admitting: Licensed Clinical Social Worker

## 2020-07-11 ENCOUNTER — Ambulatory Visit (INDEPENDENT_AMBULATORY_CARE_PROVIDER_SITE_OTHER): Payer: No Typology Code available for payment source | Admitting: Licensed Clinical Social Worker

## 2020-07-11 ENCOUNTER — Other Ambulatory Visit: Payer: Self-pay

## 2020-07-11 DIAGNOSIS — F411 Generalized anxiety disorder: Secondary | ICD-10-CM | POA: Diagnosis not present

## 2020-07-11 DIAGNOSIS — F331 Major depressive disorder, recurrent, moderate: Secondary | ICD-10-CM | POA: Diagnosis not present

## 2020-07-11 NOTE — Progress Notes (Signed)
Virtual Visit via Video Note  I connected with Stefanie Fitzgerald on 07/11/20 at 11:00 AM EST by a video enabled telemedicine application and verified that I am speaking with the correct person using two identifiers.  Location: Patient: Home Provider: Home Office   I discussed the limitations of evaluation and management by telemedicine and the availability of in person appointments. The patient expressed understanding and agreed to proceed.  THERAPY PROGRESS NOTE  Session Time: 25 Minutes  Participation Level: Active  Behavioral Response: Well GroomedAlertEuthymic  Type of Therapy: Individual Therapy  Treatment Goals addressed: Anxiety and Coping  Interventions: CBT  Summary: Stefanie Fitzgerald is a 29 y.o. female who presents with depression and anxiety sxs. Pt reported she is feeling much better since last session. Pt reported she is planning a big birthday surprise for her mother with the family and found a new church that "aligned with what I thought and felt". Pt reported that she is hopeful about relationship dynamic with father "who gave me the reigns to do the party planning for mom's birthday" and trip to [place]. Pt reported that she has a passion for planning memorable moments and goes on yearly trips with her sister to different places. Pt reported no concerns at this time.  Suicidal/Homicidal: No  Therapist Response: Therapist met with patient for follow up. Therapist and patient explored interests, passions and use of coping skills.  Plan: Return again in 3 weeks.  Diagnosis: Axis I: Generalized Anxiety Disorder and MDD, Recurrent, Moderate    Axis II: N/A  Josephine Igo, LCSW, LCAS 07/11/2020

## 2020-08-01 ENCOUNTER — Encounter: Payer: Self-pay | Admitting: Licensed Clinical Social Worker

## 2020-08-01 ENCOUNTER — Ambulatory Visit (INDEPENDENT_AMBULATORY_CARE_PROVIDER_SITE_OTHER): Payer: No Typology Code available for payment source

## 2020-08-01 ENCOUNTER — Ambulatory Visit (INDEPENDENT_AMBULATORY_CARE_PROVIDER_SITE_OTHER): Payer: No Typology Code available for payment source | Admitting: Licensed Clinical Social Worker

## 2020-08-01 ENCOUNTER — Other Ambulatory Visit: Payer: Self-pay

## 2020-08-01 DIAGNOSIS — Z3042 Encounter for surveillance of injectable contraceptive: Secondary | ICD-10-CM

## 2020-08-01 DIAGNOSIS — F331 Major depressive disorder, recurrent, moderate: Secondary | ICD-10-CM | POA: Diagnosis not present

## 2020-08-01 DIAGNOSIS — F411 Generalized anxiety disorder: Secondary | ICD-10-CM

## 2020-08-01 MED ORDER — MEDROXYPROGESTERONE ACETATE 150 MG/ML IM SUSP
150.0000 mg | Freq: Once | INTRAMUSCULAR | Status: AC
  Administered 2020-08-01: 150 mg via INTRAMUSCULAR

## 2020-08-01 NOTE — Progress Notes (Signed)
Virtual Visit via Video Note  I connected with Stefanie Fitzgerald on 08/01/20 at  9:00 AM EST by a video enabled telemedicine application and verified that I am speaking with the correct person using two identifiers.  Participating Parties Patient Provider  Location: Patient: Home Provider: Home Office   I discussed the limitations of evaluation and management by telemedicine and the availability of in person appointments. The patient expressed understanding and agreed to proceed.  THERAPY PROGRESS NOTE  Session Time: 16 Minutes  Participation Level: Active  Behavioral Response: Well GroomedAlertAnxious  Type of Therapy: Individual Therapy  Treatment Goals addressed: Anxiety and Coping  Interventions: CBT  Summary: [name] is a 29 y.o. female who presents with depression and anxiety sxs. Pt reported that her trip out of state with family for Thanksgiving went well w/ minimal arguing. Pt reported that she is going to set ground rules around appropriate conversation in the workplace with her mother who will be starting a new job at patient's place of work. Pt reported her relationship with father is improving in some ways, however continues to get frustrated with him at times and believes that he often "nitpicks to get a reaction". Pt acknowledged she has a choice in how she reacts to triggers and is obtaining more insights through self-reflection.  Suicidal/Homicidal: No  Therapist Response: Therapist met with patient for follow up. Therapist and patient explored thoughts, feelings and reactions to stressors and family dynamics. Therapist provided psychoeducation around mirroring in parent-child relationship. Therapist validated patient experiences/feelings and offered feedback. Pt was receptive.  Plan: Return again in 1 month.  Diagnosis: Axis I: Generalized Anxiety Disorder and MDD, Recurrent, Moderate    Axis II: N/A  Josephine Igo,  LCSW, LCAS 08/01/2020

## 2020-08-01 NOTE — Progress Notes (Signed)
Pt here for depo which was given IM right glut.  NDC# [id number] 

## 2020-08-27 ENCOUNTER — Other Ambulatory Visit: Payer: Self-pay | Admitting: Family Medicine

## 2020-08-29 ENCOUNTER — Other Ambulatory Visit: Payer: Self-pay

## 2020-08-29 ENCOUNTER — Ambulatory Visit (INDEPENDENT_AMBULATORY_CARE_PROVIDER_SITE_OTHER): Payer: No Typology Code available for payment source | Admitting: Licensed Clinical Social Worker

## 2020-08-29 ENCOUNTER — Encounter: Payer: Self-pay | Admitting: Licensed Clinical Social Worker

## 2020-08-29 DIAGNOSIS — F330 Major depressive disorder, recurrent, mild: Secondary | ICD-10-CM

## 2020-08-29 DIAGNOSIS — F411 Generalized anxiety disorder: Secondary | ICD-10-CM

## 2020-08-29 NOTE — Progress Notes (Signed)
Virtual Visit via Video Note  I connected with Stefanie Fitzgerald on 08/29/20 at  9:00 AM EST by a video enabled telemedicine application and verified that I am speaking with the correct person using two identifiers.  Participating Parties Patient Provider  Location: Patient: Home  Provider: Home Office   I discussed the limitations of evaluation and management by telemedicine and the availability of in person appointments. The patient expressed understanding and agreed to proceed.  THERAPY PROGRESS NOTE  Session Time: 86 Minutes  Participation Level: Active  Behavioral Response: CasualAlertAnxious  Type of Therapy: Individual Therapy  Treatment Goals addressed: Coping  Interventions: CBT  Summary: Stefanie Fitzgerald is a 30 y.o. female who presents with minimal depression and anxiety sxs. Pt reported "I found out over the holidays that I have to move again" back with parents while both her siblings will also be moving back in the home. Pt reported that she will get to have her own space and entrance in the basement, however is a bit worried about the logistics of working from home for one of her jobs and maintaining appropriate boundaries with family. Pt was able to reflect on her feelings and thoughts as well as make use of problem-solving skills to plan ahead.  Suicidal/Homicidal: No  Therapist Response: Therapist met with patient for follow up. Therapist and patient processed thoughts, feelings and reactions to recent changes and maintaining boundaries. Therapist validated patient feelings/concerns.  Plan: Return again in 2 weeks.  Diagnosis: Axis I: Generalized Anxiety Disorder and MDD, Recurrent, Mild    Axis II: N/A  Josephine Igo, LCSW, LCAS 08/29/2020

## 2020-09-12 ENCOUNTER — Encounter: Payer: Self-pay | Admitting: Licensed Clinical Social Worker

## 2020-09-12 ENCOUNTER — Other Ambulatory Visit: Payer: Self-pay

## 2020-09-12 ENCOUNTER — Ambulatory Visit (INDEPENDENT_AMBULATORY_CARE_PROVIDER_SITE_OTHER): Payer: No Typology Code available for payment source | Admitting: Licensed Clinical Social Worker

## 2020-09-12 DIAGNOSIS — F411 Generalized anxiety disorder: Secondary | ICD-10-CM | POA: Diagnosis not present

## 2020-09-12 DIAGNOSIS — F330 Major depressive disorder, recurrent, mild: Secondary | ICD-10-CM

## 2020-09-12 NOTE — Progress Notes (Signed)
Virtual Visit via Video Note  I connected with Stefanie Fitzgerald on 09/12/20 at  9:00 AM EST by a video enabled telemedicine application and verified that I am speaking with the correct person using two identifiers.  Participating Parties Patient Provider  Location: Patient: Home Provider: Home Office   I discussed the limitations of evaluation and management by telemedicine and the availability of in person appointments. The patient expressed understanding and agreed to proceed.  THERAPY PROGRESS NOTE  Session Time: 3 Minutes  Participation Level: Active  Behavioral Response: Well GroomedAlertAnxious  Type of Therapy: Individual Therapy  Treatment Goals addressed: Anxiety and Coping  Interventions: CBT  Summary: Stefanie Fitzgerald is a 30 y.o. female who presents with mild depression and anxiety sxs. Pt reported she has been busy working and not leaving the house much, which "made me feel guilty and lazy". Pt reported she is feeling excited, however, about prospect of moving to a cousin's beach house rental in the next few months. Pt reported what brings up some level of anxiety and discomfort is "why would I say no?" to this opportunity and whether her reasons for making plans to live at the beach house for next year are "legitimate" and "rational". Pt reflected on her thoughts, feelings and reactions.  Suicidal/Homicidal: No  Therapist Response: Therapist met with patient for follow up. Therapist and patient explored pros and cons of new housing opportunity. Therapist and patient discussed self-judgments and expectations around productivity and rest as well as where this schema originated based on past experiences.  Plan: Return again in 2 weeks.  Diagnosis: Axis I: Generalized Anxiety Disorder and MDD, Recurrent, Mild    Axis II: N/A  Josephine Igo, LCSW, LCAS 09/12/2020

## 2020-09-15 ENCOUNTER — Other Ambulatory Visit: Payer: Self-pay | Admitting: Family Medicine

## 2020-09-26 ENCOUNTER — Ambulatory Visit (INDEPENDENT_AMBULATORY_CARE_PROVIDER_SITE_OTHER): Payer: No Typology Code available for payment source | Admitting: Licensed Clinical Social Worker

## 2020-09-26 ENCOUNTER — Other Ambulatory Visit: Payer: Self-pay

## 2020-09-26 ENCOUNTER — Encounter: Payer: Self-pay | Admitting: Licensed Clinical Social Worker

## 2020-09-26 DIAGNOSIS — F411 Generalized anxiety disorder: Secondary | ICD-10-CM

## 2020-09-26 DIAGNOSIS — F331 Major depressive disorder, recurrent, moderate: Secondary | ICD-10-CM

## 2020-09-26 NOTE — Progress Notes (Signed)
Virtual Visit via Video Note  I connected with Sadae Arrazola Farrell-Buchanan on 09/26/20 at  9:00 AM EST by a video enabled telemedicine application and verified that I am speaking with the correct person using two identifiers.  Participating Parties Patient Provider  Location: Patient: Home  Provider: Home Office   I discussed the limitations of evaluation and management by telemedicine and the availability of in person appointments. The patient expressed understanding and agreed to proceed.  THERAPY PROGRESS NOTE  Session Time: 67 Minutes  Participation Level: Active  Behavioral Response: CasualAlertDepressed  Type of Therapy: Individual Therapy  Treatment Goals addressed: Coping  Interventions: CBT  Summary: Rokia Bosket is a 30 y.o. female who presents with mild depression and anxiety sxs. Pt reported depression sxs "comes and goes". Pt reported the last month she experienced low motivation and energy w/ little to no socialization with friends. Pt reported 2/3 months "I was more busy. I had stuff to do and accomplish. There were events, holidays and vacations. It kept me distracted from thoughts and feelings. Pt reported she is making progress in setting appropriate boundaries with family members and feels more confident in her decision to move to [facility]. Pt reported she would like to engage more socially with friends and acknowledged that making new friends/initiating friendships "is outside my comfort zone".  Suicidal/Homicidal: No  Therapist Response: Therapist met with patient for follow up. Therapist and patient reviewed progression towards goals and continued barriers for 3 month treatment plan update. Therapist and patient explored ways to create/maintain support systems and brainstormed specific objectives to effectively measure progress.   Plan: Return again in 3 weeks.  Diagnosis: Axis I: Generalized Anxiety Disorder and MDD, Recurrent,  Mild    Axis II: N/A  Josephine Igo, LCSW, LCAS 09/26/2020

## 2020-09-28 ENCOUNTER — Encounter: Payer: Self-pay | Admitting: Family Medicine

## 2020-10-09 ENCOUNTER — Ambulatory Visit: Payer: No Typology Code available for payment source | Admitting: Family Medicine

## 2020-10-09 ENCOUNTER — Other Ambulatory Visit: Payer: Self-pay

## 2020-10-09 ENCOUNTER — Encounter: Payer: Self-pay | Admitting: Family Medicine

## 2020-10-09 VITALS — BP 115/77 | HR 96 | Temp 98.5°F | Ht 66.0 in | Wt 173.0 lb

## 2020-10-09 DIAGNOSIS — F411 Generalized anxiety disorder: Secondary | ICD-10-CM | POA: Diagnosis not present

## 2020-10-09 DIAGNOSIS — F5104 Psychophysiologic insomnia: Secondary | ICD-10-CM

## 2020-10-09 DIAGNOSIS — F330 Major depressive disorder, recurrent, mild: Secondary | ICD-10-CM | POA: Diagnosis not present

## 2020-10-09 DIAGNOSIS — H53459 Other localized visual field defect, unspecified eye: Secondary | ICD-10-CM | POA: Diagnosis not present

## 2020-10-09 DIAGNOSIS — G43109 Migraine with aura, not intractable, without status migrainosus: Secondary | ICD-10-CM

## 2020-10-09 MED ORDER — SUMATRIPTAN SUCCINATE 50 MG PO TABS: 50.0000 mg | ORAL_TABLET | ORAL | 5 refills | Status: AC | PRN

## 2020-10-09 MED ORDER — TRAZODONE HCL 100 MG PO TABS: 100.0000 mg | ORAL_TABLET | Freq: Every evening | ORAL | 3 refills | Status: AC | PRN

## 2020-10-09 NOTE — Assessment & Plan Note (Signed)
Followed yb optho Could be related to complicated migraine as above

## 2020-10-09 NOTE — Assessment & Plan Note (Signed)
Continue trazodone and add melatonin if needed

## 2020-10-09 NOTE — Patient Instructions (Signed)

## 2020-10-09 NOTE — Assessment & Plan Note (Signed)
Seems to be having migraine aura with mild headache and vision field defect Could be complicated migraine Trial of sumatriptan prn No more than 2 doses per day or 3 doses per week

## 2020-10-09 NOTE — Progress Notes (Signed)
Established patient visit   Patient: Stefanie Fitzgerald   DOB: April 13, 1991   30 y.o. Female  MRN: [id number] Visit Date: 10/09/2020  Today's healthcare provider: Shirlee Latch, MD   Chief Complaint  Patient presents with  . Anxiety  . Dizziness  . Eye Problem    Pt says she has had blurry vision.   Subjective    HPI HPI    Eye Problem     Additional comments: Pt says she has had blurry vision.       Last edited by Paticia Stack, CMA on 10/09/2020  2:56 PM. (History)      Anxiety, Follow-up  She was last seen for anxiety 10 days ago. Changes made at last visit include Trazadone supply 45 day supply. She ran out due to issues getting a refill from the pharmacy x2-3 weeks.   She reports excellent compliance with treatment. She reports excellent tolerance of treatment. She is not having side effects. Pt is not sure  She feels her anxiety is moderate and Unchanged since last visit.   Symptoms: Yes chest pain Yes difficulty concentrating  Yes dizziness Yes fatigue - been very drowsy  Yes feelings of losing control Yes insomnia  No irritable No palpitations  Yes panic attacks Yes racing thoughts  No shortness of breath Yes sweating - says she is very fidgity   No tremors/shakes    GAD-7 Results GAD-7 Generalized Anxiety Disorder Screening Tool 06/12/2020 05/13/2020 03/01/2019  1. Feeling Nervous, Anxious, or on Edge 1 1 1   2. Not Being Able to Stop or Control Worrying 2 1 1   3. Worrying Too Much About Different Things 2 1 1   4. Trouble Relaxing 1 2 0  5. Being So Restless it's Hard To Sit Still 1 3 1   6. Becoming Easily Annoyed or Irritable 1 3 1   7. Feeling Afraid As If Something Awful Might Happen 0 0 0  Total GAD-7 Score 8 11 5   Difficulty At Work, Home, or Getting  Along With Others? Somewhat difficult Somewhat difficult Not difficult at all    PHQ-9 Scores PHQ9 SCORE ONLY 10/09/2020 06/12/2020 05/13/2020  PHQ-9 Total Score 21 14 20      --------------------------------------------------------------------------------------------------- Dizziness  She reports new onset dizziness. She describes it as feeling light headed and vision blacks out, occurs almost constantly. Pt says in Jan 2022 she had it about everyday., and starts around the afternoon.  It typically occurs when she is getting up and sitting down and also when she is standing.. It is usually relieved by nothing.. She has not started new medications around the time the dizziness started.   Associated symptoms: No hearing loss No tinnitus  Yes chest discomfort No heart palpitations  Yes heart racing Yes numbness or tingling of extremities - in toes  Yes nausea No vomiting  No speech difficulty Yes visual changes    Wt Readings from Last 3 Encounters:  10/09/20 173 lb (78.5 kg)  05/13/20 173 lb 6.4 oz (78.7 kg)  04/08/20 171 lb (77.6 kg)    BP Readings from Last 3 Encounters:  10/09/20 115/77  05/13/20 106/67  04/08/20 90/60      Lab Results  Component Value Date   WBC 9.4 03/31/2020   HGB 12.9 03/31/2020   HCT 40.2 03/31/2020   MCV 89 03/31/2020   PLT 253 03/31/2020   Lab Results  Component Value Date   NA 139 03/31/2020   K 4.1 03/31/2020   CO2 22 03/31/2020  BUN 15 03/31/2020   CREATININE 0.78 03/31/2020   CALCIUM 9.6 03/31/2020   GLUCOSE 83 03/31/2020     ---------------------------------------------------------------------------------------------------   Past Medical History:  Diagnosis Date  . Anxiety   . Stress 2018   and panic       Medications: Outpatient Medications Prior to Visit  Medication Sig  . citalopram (CELEXA) 40 MG tablet TAKE 1 TABLET BY MOUTH EVERY DAY (**[facility] OR MAIL ORDER REQ)  . hydrOXYzine (ATARAX/VISTARIL) 10 MG tablet Take 1 tablet (10 mg total) by mouth 3 (three) times daily as needed.  . medroxyPROGESTERone (DEPO-PROVERA) 150 MG/ML injection Inject 1 mL (150 mg total) into the muscle every 3  (three) months. Administered by [facility]  . meloxicam (MOBIC) 15 MG tablet Take 1 tablet (15 mg total) by mouth daily.  . [DISCONTINUED] traZODone (DESYREL) 50 MG tablet TAKE 0.5-1 TABLETS (25-50 MG TOTAL) BY MOUTH AT BEDTIME AS NEEDED FOR SLEEP.   No facility-administered medications prior to visit.    Review of Systems - per HPI     Objective    BP 115/77 (BP Location: Left Arm, Patient Position: Sitting, Cuff Size: Large)   Pulse 96   Temp 98.5 F (36.9 C) (Oral)   Ht 5\' 6"  (1.676 m)   Wt 173 lb (78.5 kg)   BMI 27.92 kg/m     Physical Exam Vitals reviewed.  Constitutional:      General: She is not in acute distress.    Appearance: Normal appearance.  HENT:     Head: Normocephalic and atraumatic.  Eyes:     Conjunctiva/sclera: Conjunctivae normal.  Cardiovascular:     Rate and Rhythm: Normal rate and regular rhythm.     Heart sounds: Normal heart sounds. No murmur heard.   Pulmonary:     Effort: Pulmonary effort is normal. No respiratory distress.     Breath sounds: Normal breath sounds. No wheezing.  Musculoskeletal:     Right lower leg: No edema.     Left lower leg: No edema.  Neurological:     Mental Status: She is alert and oriented to person, place, and time.  Psychiatric:        Mood and Affect: Mood normal.        Behavior: Behavior normal.      No results found for any visits on 10/09/20.  Assessment & Plan     Problem List Items Addressed This Visit      Cardiovascular and Mediastinum   Complicated migraine    Seems to be having migraine aura with mild headache and vision field defect Could be complicated migraine Trial of sumatriptan prn No more than 2 doses per day or 3 doses per week      Relevant Medications   traZODone (DESYREL) 100 MG tablet   SUMAtriptan (IMITREX) 50 MG tablet     Other   GAD (generalized anxiety disorder) - Primary    Improved since resuming trazodone  Continue trazodone and celexa at current  doses Continue therapy      Relevant Medications   traZODone (DESYREL) 100 MG tablet   MDD (major depressive disorder)    Chronic and stbale Continue celexa      Relevant Medications   traZODone (DESYREL) 100 MG tablet   Psychophysiologic insomnia    Continue trazodone and add melatonin if needed      Peripheral visual field defect    Followed yb optho Could be related to complicated migraine as above  Return in about 6 weeks (around 11/20/2020) for virtual ok, MDD/GAD f/u.      I, Shirlee Latch, MD, have reviewed all documentation for this visit. The documentation on 10/09/20 for the exam, diagnosis, procedures, and orders are all accurate and complete.   Verlinda Slotnick, Marzella Schlein, MD, MPH [facility]

## 2020-10-09 NOTE — Assessment & Plan Note (Signed)
Improved since resuming trazodone  Continue trazodone and celexa at current doses Continue therapy

## 2020-10-09 NOTE — Assessment & Plan Note (Signed)
Chronic and stbale Continue celexa

## 2020-10-17 ENCOUNTER — Other Ambulatory Visit: Payer: Self-pay

## 2020-10-17 ENCOUNTER — Encounter: Payer: Self-pay | Admitting: Licensed Clinical Social Worker

## 2020-10-17 ENCOUNTER — Ambulatory Visit (INDEPENDENT_AMBULATORY_CARE_PROVIDER_SITE_OTHER): Payer: No Typology Code available for payment source | Admitting: Licensed Clinical Social Worker

## 2020-10-17 DIAGNOSIS — F411 Generalized anxiety disorder: Secondary | ICD-10-CM | POA: Diagnosis not present

## 2020-10-17 DIAGNOSIS — F330 Major depressive disorder, recurrent, mild: Secondary | ICD-10-CM

## 2020-10-17 NOTE — Progress Notes (Signed)
Virtual Visit via Video Note  I connected with Stefanie Fitzgerald on 10/17/20 at 11:00 AM EST by a video enabled telemedicine application and verified that I am speaking with the correct person using two identifiers.  Participating Parties Patient Provider  Location: Patient: Home Provider: Home Office   I discussed the limitations of evaluation and management by telemedicine and the availability of in person appointments. The patient expressed understanding and agreed to proceed.  THERAPY PROGRESS NOTE  Session Time: 75 Minutes  Participation Level: Active  Behavioral Response: Well GroomedAlertAnxious  Type of Therapy: Individual Therapy  Treatment Goals addressed: Anxiety and Coping  Interventions: CBT  Summary: Stefanie Fitzgerald is a 30 y.o. female who presents with depression and anxiety sxs. Pt reported "a lot has happened" since last session. "I had a panic attack at work" that led to EMS being called. Pt reported she got lab work done and found nothing abnormal. Pt reported her doctor thinks it may have been stress induced in combination with sudden lapse in trazodone due to refill expiring. Pt reported after that incident she had another health scare involving loss of vision due to dx of "complicated migraine" and will be following up with her eye doctor for more testing. 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 Pt acknowledged "I thought I was handling my stress pretty well, but body told me otherwise. It seems like just one thing after another". Pt reported that she has noticed a pattern in the last few years that between months of January and March she would have to take time off of work for experiencing significant stress. Pt reported she is taking this as a sign that "I need stability. I need to decompress and relax". Pt reported she has  experienced some good days, despite everything such as engaging in physical exercise recently as well as catching up with some of her church friends/supports. Pt reported she continues to be committed to goal of moving to family beach house in attempts to gain independence and personal space from demands/expectations of family. Pt reflected on her childhood, often feeling like "I was the therapist. They dumped everything on me".  Suicidal/Homicidal: No  Therapist Response: Therapist met with patient for follow up. Therapist and patient reviewed increase in sxs and attempts to cope as well as insights gained from recent health issues. Therapist and patient processed thoughts and feelings. Therapist validated patient feelings/concerns.   Plan: Return again in 2 weeks.  Diagnosis: Axis I: Generalized Anxiety Disorder and MDD, Recurrent, Mild    Axis II: N/A  Josephine Igo, LCSW, LCAS 10/17/2020

## 2020-10-21 ENCOUNTER — Other Ambulatory Visit: Payer: Self-pay | Admitting: Obstetrics and Gynecology

## 2020-10-21 DIAGNOSIS — Z3042 Encounter for surveillance of injectable contraceptive: Secondary | ICD-10-CM

## 2020-10-22 ENCOUNTER — Other Ambulatory Visit: Payer: Self-pay | Admitting: Obstetrics and Gynecology

## 2020-10-22 ENCOUNTER — Encounter: Payer: Self-pay | Admitting: Obstetrics and Gynecology

## 2020-10-22 DIAGNOSIS — Z3042 Encounter for surveillance of injectable contraceptive: Secondary | ICD-10-CM

## 2020-10-24 ENCOUNTER — Other Ambulatory Visit: Payer: Self-pay

## 2020-10-24 ENCOUNTER — Ambulatory Visit (INDEPENDENT_AMBULATORY_CARE_PROVIDER_SITE_OTHER): Payer: No Typology Code available for payment source

## 2020-10-24 DIAGNOSIS — Z3042 Encounter for surveillance of injectable contraceptive: Secondary | ICD-10-CM

## 2020-10-24 MED ORDER — MEDROXYPROGESTERONE ACETATE 150 MG/ML IM SUSP
150.0000 mg | Freq: Once | INTRAMUSCULAR | Status: AC
  Administered 2020-10-24: 150 mg via INTRAMUSCULAR

## 2020-10-24 NOTE — Progress Notes (Signed)
Patient presents today for Depo Provera injection within dates. Given IM LUOQ. Patient tolerated well. 

## 2020-10-31 ENCOUNTER — Ambulatory Visit (INDEPENDENT_AMBULATORY_CARE_PROVIDER_SITE_OTHER): Payer: No Typology Code available for payment source | Admitting: Licensed Clinical Social Worker

## 2020-10-31 ENCOUNTER — Other Ambulatory Visit: Payer: Self-pay

## 2020-10-31 ENCOUNTER — Encounter: Payer: Self-pay | Admitting: Licensed Clinical Social Worker

## 2020-10-31 DIAGNOSIS — F330 Major depressive disorder, recurrent, mild: Secondary | ICD-10-CM

## 2020-10-31 DIAGNOSIS — F411 Generalized anxiety disorder: Secondary | ICD-10-CM | POA: Diagnosis not present

## 2020-10-31 NOTE — Progress Notes (Signed)
Virtual Visit via Telephone Note  I connected with Stefanie Fitzgerald on 10/31/20 at 11:00 AM EST by telephone and verified that I am speaking with the correct person using two identifiers.  Participating Parties Patient Provider  Location: Patient: Home Provider: Home Office   I discussed the limitations, risks, security and privacy concerns of performing an evaluation and management service by telephone and the availability of in person appointments. I also discussed with the patient that there may be a patient responsible charge related to this service. The patient expressed understanding and agreed to proceed.  THERAPY PROGRESS NOTE  Session Time: 30 Minutes  Participation Level: Active  Behavioral Response: AlertEuthymic  Type of Therapy: Individual Therapy  Treatment Goals addressed: Coping  Interventions: CBT  Summary: Stefanie Fitzgerald is a 30 y.o. female who presents with minimal depression and anxiety sxs. Pt reported "things having been going well" and has not experienced any panic attacks since last session. Pt reported she has her weekends back now that she quit working at [employer]. Pt is looking forward to visiting cousin at the beach she will be moving to next month. Pt reported she is getting outside more, feeling more motivated to do things around the house and cleaning her space. Pt reported "I am starting to feel normal again" and that it is nice "not feeling overwhelmed". Pt reported she continues to be compliant with medication and started taking it at night. Pt reported no other concerns at this time.    Suicidal/Homicidal: No  Therapist Response: Therapist met with patient for follow up. Therapist and patient reviewed PHQ2-9, C-SRSS, nutrition and pain assessments. Therapist and patient explored coping skills used and engagement in pleasant activities. Therapist and patient reviewed overall improved mood and reduction in  sxs.  Plan: Return again in 2 weeks.  Diagnosis: Axis I: Generalized Anxiety Disorder and MDD, Recurrent, Mild    Axis II: N/A  Josephine Igo, LCSW, LCAS 10/31/2020

## 2020-11-14 ENCOUNTER — Encounter: Payer: Self-pay | Admitting: Licensed Clinical Social Worker

## 2020-11-14 ENCOUNTER — Other Ambulatory Visit: Payer: Self-pay

## 2020-11-14 ENCOUNTER — Ambulatory Visit (INDEPENDENT_AMBULATORY_CARE_PROVIDER_SITE_OTHER): Payer: No Typology Code available for payment source | Admitting: Licensed Clinical Social Worker

## 2020-11-14 DIAGNOSIS — F411 Generalized anxiety disorder: Secondary | ICD-10-CM

## 2020-11-14 DIAGNOSIS — F3341 Major depressive disorder, recurrent, in partial remission: Secondary | ICD-10-CM | POA: Diagnosis not present

## 2020-11-14 NOTE — Progress Notes (Signed)
Virtual Visit via Telephone Note  I connected with Stefanie Fitzgerald on 11/14/20 at  9:00 AM EDT by telephone and verified that I am speaking with the correct person using two identifiers.  Participating Parties Patient Provider  Location: Patient: Home Provider: Home Office   I discussed the limitations, risks, security and privacy concerns of performing an evaluation and management service by telephone and the availability of in person appointments. I also discussed with the patient that there may be a patient responsible charge related to this service. The patient expressed understanding and agreed to proceed.  THERAPY PROGRESS NOTE  Session Time: 30 Minutes  Participation Level: Active  Behavioral Response: AlertEuthymic  Type of Therapy: Individual Therapy  Treatment Goals addressed: Anxiety and Coping  Interventions: CBT  Summary: Stefanie Fitzgerald is a 30 y.o. female who presents with with minimal depression and anxiety sxs. Pt reported she is [place] about [place] to the beach and has made her first visit to plan out her move. Pt reported she spent a weekend with her cousin and will be starting to move out next weekend. Pt reported no panic attacks since last session and is in a good headspace about this transition in her life.  Suicidal/Homicidal: No  Therapist Response: Therapist met with patient for follow up. Therapist and patient reviewed updates and coping thoughts around major life transitions. Therapist informed patient regarding initial phase of terminating services with this therapist due to leaving the practice in the next 5 weeks. Therapist and patient discussed options for transitioning care.  Plan: Return again in 2 weeks.  Diagnosis: Axis I: Generalized Anxiety Disorder and MDD, In Remission    Axis II: N/A  Josephine Igo, LCSW, LCAS 11/14/2020

## 2020-11-18 ENCOUNTER — Encounter: Payer: Self-pay | Admitting: Obstetrics and Gynecology

## 2020-11-18 ENCOUNTER — Ambulatory Visit (INDEPENDENT_AMBULATORY_CARE_PROVIDER_SITE_OTHER): Payer: No Typology Code available for payment source | Admitting: Obstetrics and Gynecology

## 2020-11-18 ENCOUNTER — Other Ambulatory Visit: Payer: Self-pay

## 2020-11-18 VITALS — BP 100/60 | Ht 66.0 in | Wt 168.0 lb

## 2020-11-18 DIAGNOSIS — Z01419 Encounter for gynecological examination (general) (routine) without abnormal findings: Secondary | ICD-10-CM

## 2020-11-18 DIAGNOSIS — Z3042 Encounter for surveillance of injectable contraceptive: Secondary | ICD-10-CM

## 2020-11-18 DIAGNOSIS — Z1151 Encounter for screening for human papillomavirus (HPV): Secondary | ICD-10-CM

## 2020-11-18 DIAGNOSIS — Z124 Encounter for screening for malignant neoplasm of cervix: Secondary | ICD-10-CM | POA: Diagnosis not present

## 2020-11-18 MED ORDER — MEDROXYPROGESTERONE ACETATE 150 MG/ML IM SUSP: 150.0000 mg | INTRAMUSCULAR | 3 refills | Status: AC

## 2020-11-18 NOTE — Progress Notes (Signed)
PCP:  Erasmo Downer, MD   Chief Complaint  Patient presents with  . Gynecologic Exam    No concerns     HPI:      [name] is a 30 y.o. G0P0000 who LMP was No LMP recorded. Patient has had an injection., presents today for her annual examination.  Her menses are absent due to depo.  Dysmenorrhea none (much improved with depo), rare BTB. On depo for severe dysmen.   Sex activity: not sexually active (has been in distant past) Last Pap: 08/09/18 Results were: no abnormalities . Use small speculum. Hx of STDs: chlamydia  There is no FH of breast cancer. There is no FH of ovarian cancer. The patient does do self-breast exams. Had RT breast mass and LT breast pain 8/21 with neg mammo and imaging, no addl imaging needed. Has occas OQ breast tenderness, drinks caffeine.  Tobacco use: The patient denies current or previous tobacco use. Alcohol use: social drinker No drug use.  Exercise: mod active  She does get adequate calcium but not Vitamin D in her diet.   Past Medical History:  Diagnosis Date  . Anxiety   . Stress 2018   and panic    Past Surgical History:  Procedure Laterality Date  . BREAST EXCISIONAL BIOPSY    . 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 TONSILLECTOMY  1996  . WISDOM TOOTH EXTRACTION      Family History  Problem Relation Age of Onset  . Depression Mother   . Anxiety disorder Mother   . Healthy Father   . Anxiety disorder Sister   . Depression Brother   . Anxiety disorder Brother   . Lung cancer Maternal Grandfather        smoker  . Breast cancer Neg Hx   . Colon cancer Neg Hx     Social History   Socioeconomic History  . Marital status: Single    Spouse name: Not on file  . Number of children: 0  . Years of education: bachelor's  . Highest education level: Bachelor's degree (e.g., BA, AB, BS)  Occupational History    Employer: [employer]    Comment: FULL TIME  Tobacco Use  . Smoking status:  Never Smoker  . Smokeless tobacco: Never Used  Vaping Use  . Vaping Use: Never used  Substance and Sexual Activity  . Alcohol use: Yes    Comment: 1-2 glasses of wine per month  . Drug use: No  . Sexual activity: Not Currently    Birth control/protection: Injection  Other Topics Concern  . Not on file  Social History Narrative  . Not on file   Social Determinants of Health   Financial Resource Strain: Not on file  Food Insecurity: Not on file  Transportation Needs: Not on file  Physical Activity: Not on file  Stress: Not on file  Social Connections: Not on file  Intimate Partner Violence: Not on file    Current Meds  Medication Sig  . citalopram (CELEXA) 40 MG tablet TAKE 1 TABLET BY MOUTH EVERY DAY (**[facility] OR MAIL ORDER REQ)  . hydrOXYzine (ATARAX/VISTARIL) 10 MG tablet Take 1 tablet (10 mg total) by mouth 3 (three) times daily as needed.  . meloxicam (MOBIC) 15 MG tablet Take 1 tablet (15 mg total) by mouth daily.  . SUMAtriptan (IMITREX) 50 MG tablet Take 1 tablet (50 mg total) by mouth every 2 (two) hours as needed for migraine. May repeat  in 2 hours if headache persists or recurs.  . traZODone (DESYREL) 100 MG tablet Take 1 tablet (100 mg total) by mouth at bedtime as needed for sleep.  . [DISCONTINUED] medroxyPROGESTERone (DEPO-PROVERA) 150 MG/ML injection INJECT 1 ML (150 MG TOTAL) INTO THE MUSCLE EVERY 3 (THREE) MONTHS. ADMINISTERED BY [facility]     ROS:  Review of Systems  Constitutional: Negative for fatigue, fever and unexpected weight change.  Respiratory: Negative for cough, shortness of breath and wheezing.   Cardiovascular: Negative for chest pain, palpitations and leg swelling.  Gastrointestinal: Negative for blood in stool, constipation, diarrhea, nausea and vomiting.  Endocrine: Negative for cold intolerance, heat intolerance and polyuria.  Genitourinary: Negative for dyspareunia, dysuria, flank pain, frequency, genital sores, hematuria,  menstrual problem, pelvic pain, urgency, vaginal bleeding, vaginal discharge and vaginal pain.  Musculoskeletal: Negative for back pain, joint swelling and myalgias.  Skin: Negative for rash.  Neurological: Negative for dizziness, syncope, light-headedness, numbness and headaches.  Hematological: Negative for adenopathy.  Psychiatric/Behavioral: Negative for agitation, confusion, sleep disturbance and suicidal ideas. The patient is not nervous/anxious.      Objective: BP 100/60   Ht 5\' 6"  (1.676 m)   Wt 168 lb (76.2 kg)   BMI 27.12 kg/m    Physical Exam Constitutional:      Appearance: She is well-developed.  Genitourinary:     Vulva normal.     Right Labia: No rash, tenderness or lesions.    Left Labia: No tenderness, lesions or rash.    No vaginal discharge, erythema or tenderness.      Right Adnexa: not tender and no mass present.    Left Adnexa: not tender and no mass present.    No cervical friability or polyp.     Uterus is not enlarged or tender.  Breasts:     Right: No mass, nipple discharge, skin change or tenderness.     Left: No mass, nipple discharge, skin change or tenderness.    Neck:     Thyroid: No thyromegaly.  Cardiovascular:     Rate and Rhythm: Normal rate and regular rhythm.     Heart sounds: Normal heart sounds. No murmur heard.   Pulmonary:     Effort: Pulmonary effort is normal.     Breath sounds: Normal breath sounds.  Abdominal:     Palpations: Abdomen is soft.     Tenderness: There is no abdominal tenderness. There is no guarding or rebound.  Musculoskeletal:        General: Normal range of motion.     Cervical back: Normal range of motion.  Lymphadenopathy:     Cervical: No cervical adenopathy.  Neurological:     General: No focal deficit present.     Mental Status: She is alert and oriented to person, place, and time.     Cranial Nerves: No cranial nerve deficit.  Skin:    General: Skin is warm and dry.  Psychiatric:        Mood  and Affect: Mood normal.        Behavior: Behavior normal.        Thought Content: Thought content normal.        Judgment: Judgment normal.  Vitals reviewed.     Assessment/Plan: Encounter for annual routine gynecological examination  Cervical cancer screening - Plan: IGP, Aptima HPV  Screening for HPV (human papillomavirus) - Plan: IGP, Aptima HPV  Encounter for surveillance of injectable contraceptive - Plan: medroxyPROGESTERone (DEPO-PROVERA) 150 MG/ML injection; Rx  RF. Cont ca/add Vit D. F/u prn.  Meds ordered this encounter  Medications  . medroxyPROGESTERone (DEPO-PROVERA) 150 MG/ML injection    Sig: Inject 1 mL (150 mg total) into the muscle every 3 (three) months.    Dispense:  1 mL    Refill:  3    DX Code Needed  .    Order Specific Question:   Supervising Provider    Answer:   Nadara Mustard [[id number]]             GYN counsel adequate intake of calcium and vitamin D     F/U  Return in about 1 year (around 11/18/2021).  Bailee Thall B. Maejor Erven, PA-C 11/18/2020 4:23 PM

## 2020-11-18 NOTE — Patient Instructions (Signed)
I value your feedback and you entrusting us with your care. If you get a  patient survey, I would appreciate you taking the time to let us know about your experience today. Thank you! ? ? ?

## 2020-11-20 ENCOUNTER — Telehealth (INDEPENDENT_AMBULATORY_CARE_PROVIDER_SITE_OTHER): Payer: No Typology Code available for payment source | Admitting: Family Medicine

## 2020-11-20 ENCOUNTER — Encounter: Payer: Self-pay | Admitting: Family Medicine

## 2020-11-20 ENCOUNTER — Ambulatory Visit: Payer: No Typology Code available for payment source | Admitting: Family Medicine

## 2020-11-20 DIAGNOSIS — G43109 Migraine with aura, not intractable, without status migrainosus: Secondary | ICD-10-CM

## 2020-11-20 DIAGNOSIS — F330 Major depressive disorder, recurrent, mild: Secondary | ICD-10-CM | POA: Diagnosis not present

## 2020-11-20 DIAGNOSIS — F411 Generalized anxiety disorder: Secondary | ICD-10-CM | POA: Diagnosis not present

## 2020-11-20 DIAGNOSIS — F5104 Psychophysiologic insomnia: Secondary | ICD-10-CM | POA: Diagnosis not present

## 2020-11-20 NOTE — Assessment & Plan Note (Signed)
Chronic and well controlled Continue therapy Continue celexa at current dose

## 2020-11-20 NOTE — Assessment & Plan Note (Signed)
No further aura or visual field deficits Sumatriptan works well and can continue prn

## 2020-11-20 NOTE — Assessment & Plan Note (Signed)
Chronic and mostly well controlled Some stress causing a few nights that are sleepless recently Continue trazodone at current dose

## 2020-11-20 NOTE — Progress Notes (Signed)
MyChart Video Visit    Virtual Visit via Video Note   This visit type was conducted due to national recommendations for restrictions regarding the COVID-19 Pandemic (e.g. social distancing) in an effort to limit this patient's exposure and mitigate transmission in our community. This patient is at least at moderate risk for complications without adequate follow up. This format is felt to be most appropriate for this patient at this time. Physical exam was limited by quality of the video and audio technology used for the visit.    Patient location: home Provider location: [facility] Persons involved in the visit: patient, provider  I discussed the limitations of evaluation and management by telemedicine and the availability of in person appointments. The patient expressed understanding and agreed to proceed.  Patient: Stefanie Fitzgerald   DOB: [birth year]   30 y.o. Female  MRN: [id number] Visit Date: 11/20/2020  Today's healthcare provider: Shirlee Latch, MD   Chief Complaint  Patient presents with  . Headache  . Depression  . Anxiety   Subjective    HPI   No further visual impairments. Had some headaches and sumatriptan was helpful with that. Weather front changes cause migraines lately.  Social History   Tobacco Use  . Smoking status: Never Smoker  . Smokeless tobacco: Never Used  Vaping Use  . Vaping Use: Never used  Substance Use Topics  . Alcohol use: Yes    Comment: 1-2 glasses of wine per month  . Drug use: No      Medications: Outpatient Medications Prior to Visit  Medication Sig  . citalopram (CELEXA) 40 MG tablet TAKE 1 TABLET BY MOUTH EVERY DAY (**[facility] OR MAIL ORDER REQ)  . hydrOXYzine (ATARAX/VISTARIL) 10 MG tablet Take 1 tablet (10 mg total) by mouth 3 (three) times daily as needed.  . medroxyPROGESTERone (DEPO-PROVERA) 150 MG/ML injection Inject 1 mL (150 mg total) into the muscle every 3 (three) months.  .  meloxicam (MOBIC) 15 MG tablet Take 1 tablet (15 mg total) by mouth daily.  . SUMAtriptan (IMITREX) 50 MG tablet Take 1 tablet (50 mg total) by mouth every 2 (two) hours as needed for migraine. May repeat in 2 hours if headache persists or recurs.  . traZODone (DESYREL) 100 MG tablet Take 1 tablet (100 mg total) by mouth at bedtime as needed for sleep.   No facility-administered medications prior to visit.    Review of Systems  Eyes: Negative.   Respiratory: Negative.   Cardiovascular: Negative.   Neurological: Positive for headaches. Negative for facial asymmetry and light-headedness.  Psychiatric/Behavioral: Negative for decreased concentration, dysphoric mood, sleep disturbance and suicidal ideas. The patient is nervous/anxious.       Objective    There were no vitals taken for this visit.   Physical Exam Vitals reviewed.  Constitutional:      General: She is not in acute distress.    Appearance: She is well-developed.  HENT:     Head: Normocephalic and atraumatic.  Eyes:     General: No scleral icterus.    Conjunctiva/sclera: Conjunctivae normal.  Pulmonary:     Effort: Pulmonary effort is normal. No respiratory distress.  Neurological:     Mental Status: She is alert and oriented to person, place, and time.  Psychiatric:        Mood and Affect: Affect normal. Mood is anxious.        Speech: Speech normal.        Behavior: Behavior normal.  Thought Content: Thought content does not include suicidal ideation.        Assessment & Plan     Problem List Items Addressed This Visit      Cardiovascular and Mediastinum   Complicated migraine - Primary    No further aura or visual field deficits Sumatriptan works well and can continue prn        Other   GAD (generalized anxiety disorder)    Chronic and stable Some exacerbation related to upcoming move Continue celexa at current dose Continue therapy      MDD (major depressive disorder)    Chronic and  well controlled Continue therapy Continue celexa at current dose      Psychophysiologic insomnia    Chronic and mostly well controlled Some stress causing a few nights that are sleepless recently Continue trazodone at current dose          Return in about 5 months (around 04/22/2021) for CPE.     I discussed the assessment and treatment plan with the patient. The patient was provided an opportunity to ask questions and all were answered. The patient agreed with the plan and demonstrated an understanding of the instructions.   The patient was advised to call back or seek an in-person evaluation if the symptoms worsen or if the condition fails to improve as anticipated.   I, Shirlee Latch, MD, have reviewed all documentation for this visit. The documentation on 11/20/20 for the exam, diagnosis, procedures, and orders are all accurate and complete.   Axel Frisk, Marzella Schlein, MD, MPH [facility]

## 2020-11-20 NOTE — Assessment & Plan Note (Addendum)
Chronic and stable Some exacerbation related to upcoming move Continue celexa at current dose Continue therapy

## 2020-11-21 LAB — IGP, APTIMA HPV: HPV Aptima: NEGATIVE

## 2020-11-28 ENCOUNTER — Other Ambulatory Visit: Payer: Self-pay

## 2020-11-28 ENCOUNTER — Ambulatory Visit (INDEPENDENT_AMBULATORY_CARE_PROVIDER_SITE_OTHER): Payer: No Typology Code available for payment source | Admitting: Licensed Clinical Social Worker

## 2020-11-28 ENCOUNTER — Encounter: Payer: Self-pay | Admitting: Licensed Clinical Social Worker

## 2020-11-28 DIAGNOSIS — F411 Generalized anxiety disorder: Secondary | ICD-10-CM | POA: Diagnosis not present

## 2020-11-28 DIAGNOSIS — F3341 Major depressive disorder, recurrent, in partial remission: Secondary | ICD-10-CM

## 2020-11-28 NOTE — Progress Notes (Signed)
Virtual Visit via Video Note  I connected with [name] on 11/28/20 at 10:00 AM EDT by a video enabled telemedicine application and verified that I am speaking with the correct person using two identifiers.  Participating Parties Patient Provider  Location: Patient: Home Provider: Home Office   I discussed the limitations of evaluation and management by telemedicine and the availability of in person appointments. The patient expressed understanding and agreed to proceed.  THERAPY PROGRESS NOTE  Session Time: 46 Minutes  Participation Level: Active  Behavioral Response: Well GroomedAlertEuthymic  Type of Therapy: Individual Therapy  Treatment Goals addressed: Anxiety and Coping  Interventions: CBT  Summary: Stefanie Fitzgerald is a 30 y.o. female who presents with minimal depression and anxiety sxs. Pt reported feeling "pretty good, but busy". Pt reported she [place] and [place] be fully moved in until the end of the month. Pt reported she enjoyed spending time with her cousin who was able to help her clean and organize her new living space. Pt reported that she is looking forward to this transition and making plans to see friends while she is still in town. Pt reported that she will also be looking for a new church and had recommendations from current pastor. 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 Pt explored ethical issues she faced, her relationship with the church and passion for working with kids. Pt to follow up in 2 weeks for last session with this therapist.  Suicidal/Homicidal: No  Therapist Response: Therapist met with patient for follow up. Therapist and patient discussed readiness for life transitions and revisiting some past  experiences, her growth, and spiritual journey.   Plan: Return again in 2 weeks.  Diagnosis: Axis I: Generalized Anxiety Disorder and MDD, In Remission    Axis II: N/A  Josephine Igo, LCSW, LCAS 11/28/2020

## 2020-12-08 ENCOUNTER — Other Ambulatory Visit: Payer: Self-pay | Admitting: Family Medicine

## 2020-12-08 MED ORDER — CITALOPRAM HYDROBROMIDE 40 MG PO TABS: 40.0000 mg | ORAL_TABLET | Freq: Every day | ORAL | 0 refills | Status: DC

## 2020-12-12 ENCOUNTER — Other Ambulatory Visit: Payer: Self-pay

## 2020-12-12 ENCOUNTER — Ambulatory Visit (INDEPENDENT_AMBULATORY_CARE_PROVIDER_SITE_OTHER): Payer: No Typology Code available for payment source | Admitting: Licensed Clinical Social Worker

## 2020-12-12 ENCOUNTER — Encounter: Payer: Self-pay | Admitting: Licensed Clinical Social Worker

## 2020-12-12 DIAGNOSIS — F411 Generalized anxiety disorder: Secondary | ICD-10-CM | POA: Diagnosis not present

## 2020-12-12 DIAGNOSIS — F3341 Major depressive disorder, recurrent, in partial remission: Secondary | ICD-10-CM

## 2020-12-12 NOTE — Progress Notes (Signed)
Virtual Visit via Video Note  I connected with Stefanie Fitzgerald on 12/12/20 at 10:00 AM EDT by a video enabled telemedicine application and verified that I am speaking with the correct person using two identifiers.  Participating Parties Patient Provider  Location: Patient: House sitting for a friend in [place] Provider: Home Office   I discussed the limitations of evaluation and management by telemedicine and the availability of in person appointments. The patient expressed understanding and agreed to proceed.  THERAPY PROGRESS NOTE  Session Time: 60 Minutes  Participation Level: Active  Behavioral Response: CasualAlertEuthymic  Type of Therapy: Individual Therapy  Treatment Goals addressed: Anxiety and Coping  Interventions: CBT  Summary: Stefanie Fitzgerald is a 29 y.o. female who presents with minimal depression and anxiety sxs. Pt reported she is excited about moving next week. Pt reported no major changes since last session and denied experiencing any panic attacks. Pt acknowledged that she is going to miss family and friends. Pt reported she is moving on from the church, however, "I have this immediate reaction to do everything I can to help". Pt reported that she is confident she can use the skills learned in therapy to tackle life's challenges. Pt reported no other concerns or questions at this time.  Suicidal/Homicidal: No  Therapist Response: Therapist met with patient for follow up. Therapist and patient reviewed sxs, use of coping skills, and social supports. Therapist and patient discussed insights gained and plans moving forward.  Plan: Pt is relocating and will be pursuing healthcare providers closer to new home.  Diagnosis: Axis I: Generalized Anxiety Disorder and MDD, In Remission    Axis II: N/A  Lauren P O'Reilly, LCSW, LCAS 12/12/2020  

## 2020-12-31 ENCOUNTER — Other Ambulatory Visit: Payer: Self-pay | Admitting: Family Medicine

## 2021-01-01 MED ORDER — CITALOPRAM HYDROBROMIDE 40 MG PO TABS: 40.0000 mg | ORAL_TABLET | Freq: Every day | ORAL | 0 refills | Status: AC

## 2021-06-04 ENCOUNTER — Encounter: Payer: No Typology Code available for payment source | Admitting: Family Medicine

## 2021-06-14 IMAGING — MG DIGITAL DIAGNOSTIC BILAT W/ TOMO W/ CAD
8 of 14 series · 9 of 40 positions shown · non-contrast
Comparison: None.

CLINICAL DATA: Focal pain in the left breast.

EXAM:
DIGITAL DIAGNOSTIC BILATERAL MAMMOGRAM WITH TOMO
ULTRASOUND LEFT BREAST

[R CC synth-2D (1 of 2)]
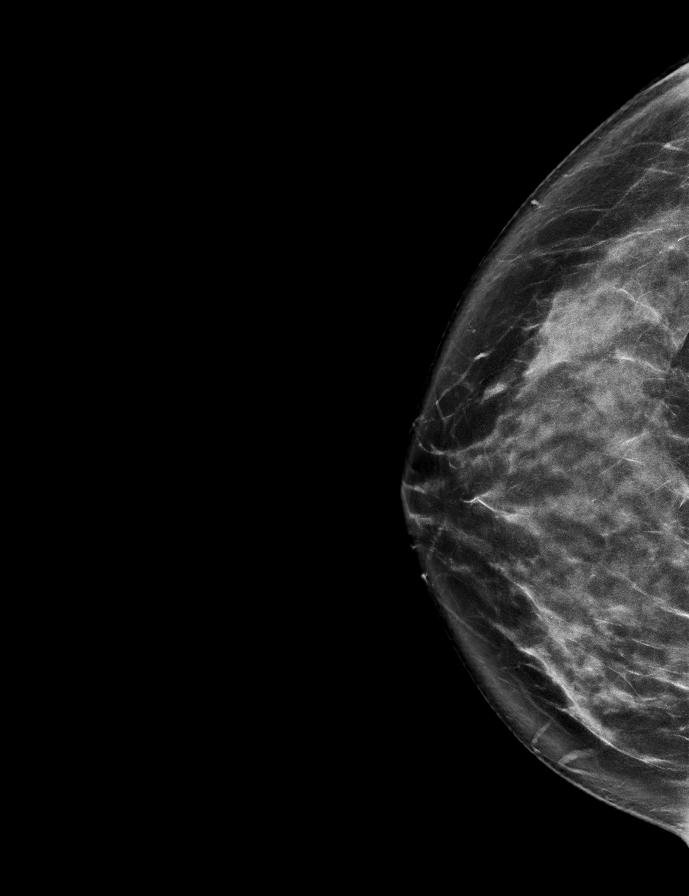

[L MLO synth-2D (1 of 2)]
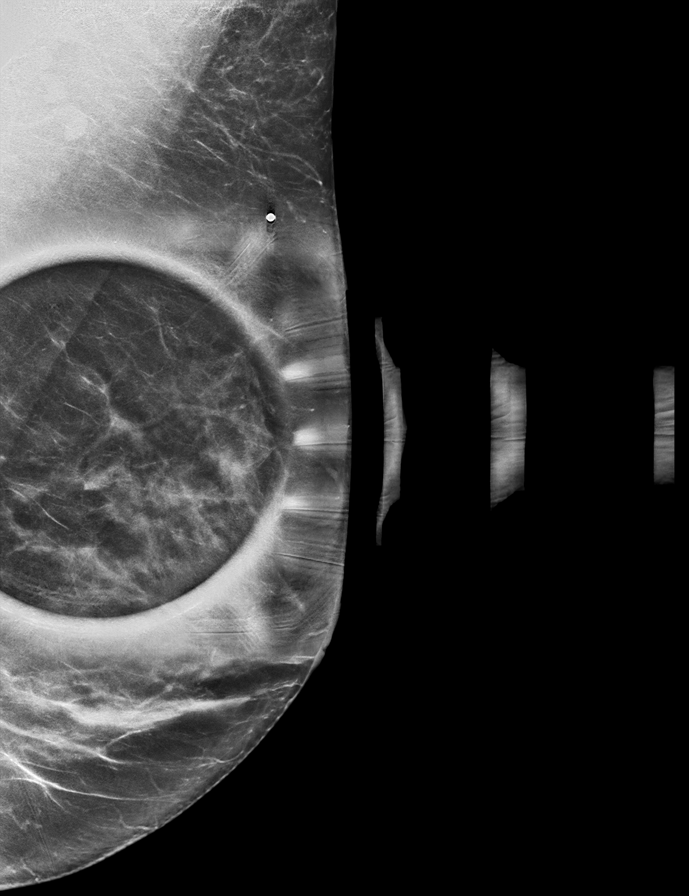

[L TAN synth-2D]
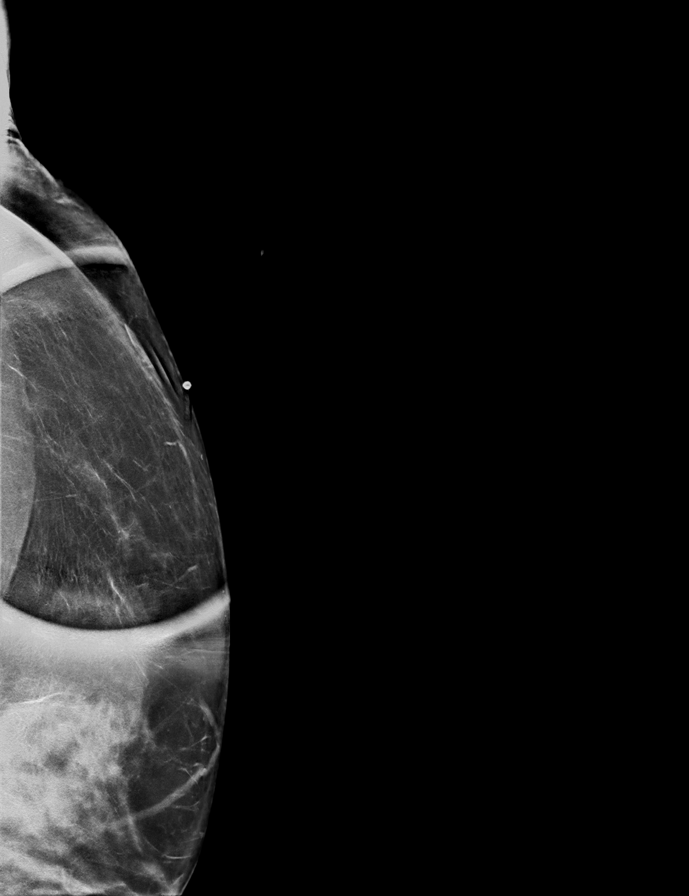

[L CC synth-2D]
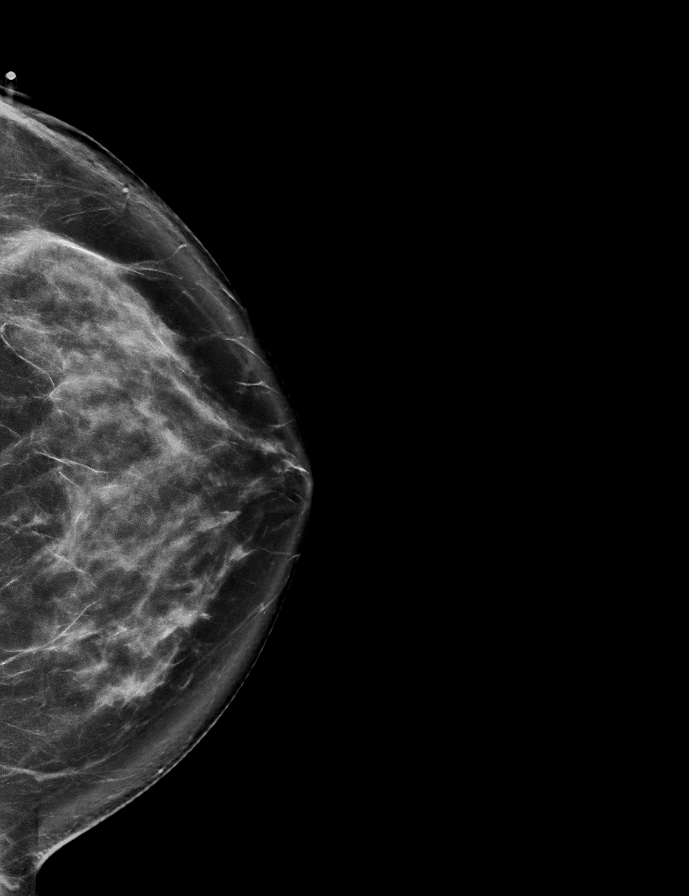

[L MLO synth-2D (2 of 2)]
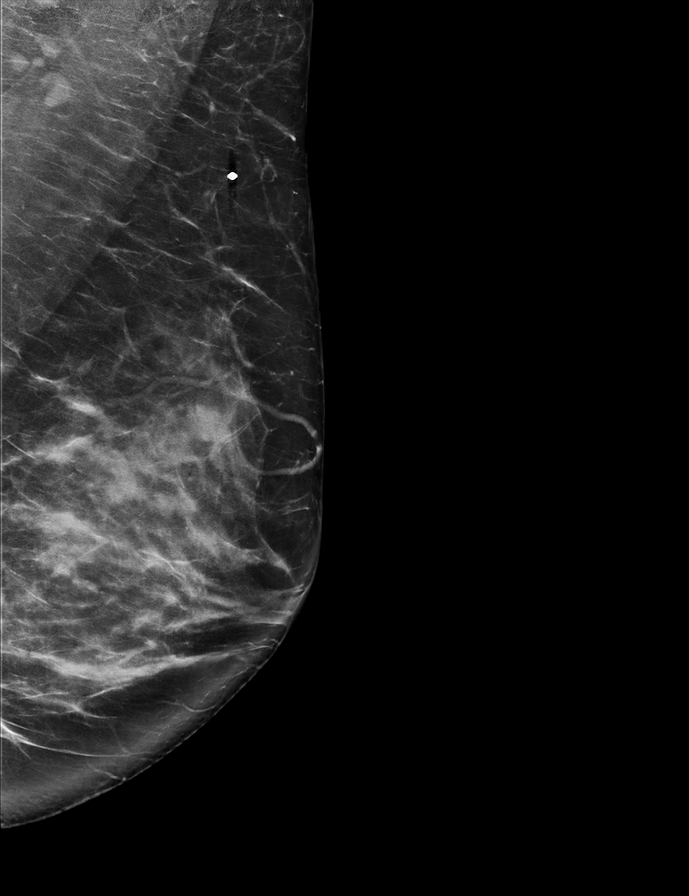

[R CC synth-2D (2 of 2)]
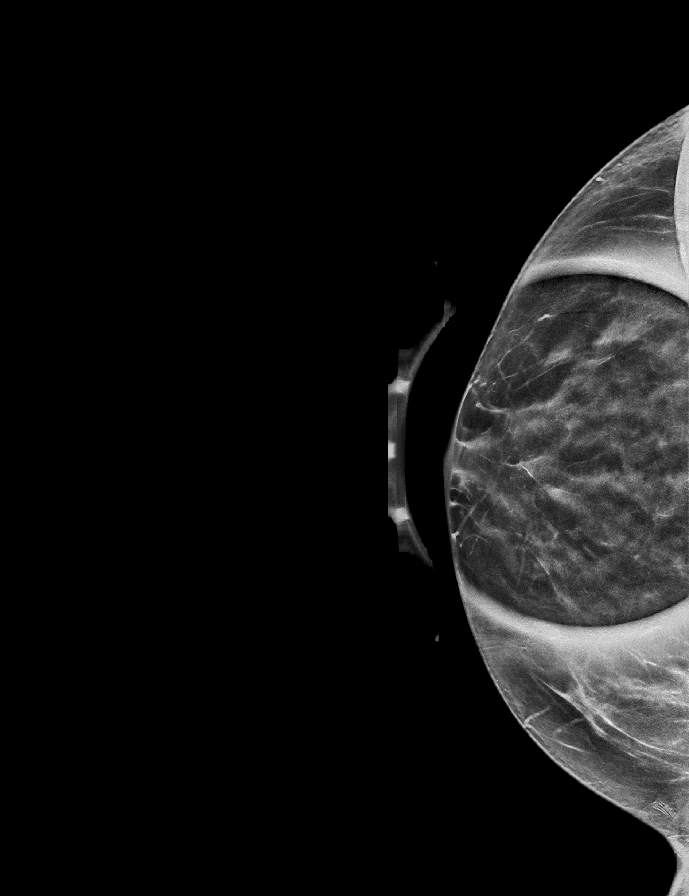

[R MLO synth-2D]
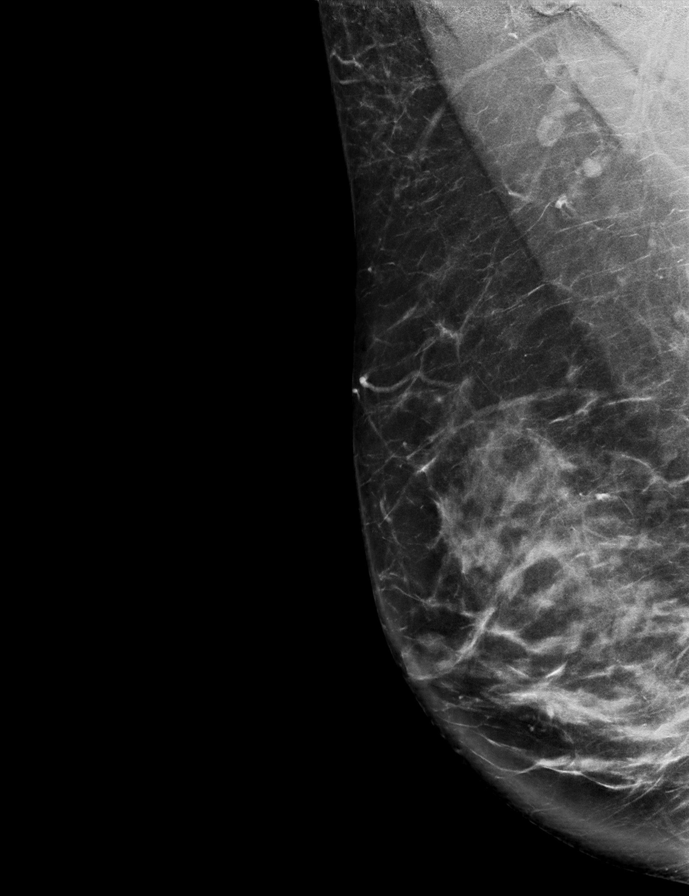

[L TAN tomo · 2 of 84 frames shown]
[frame 28/84]
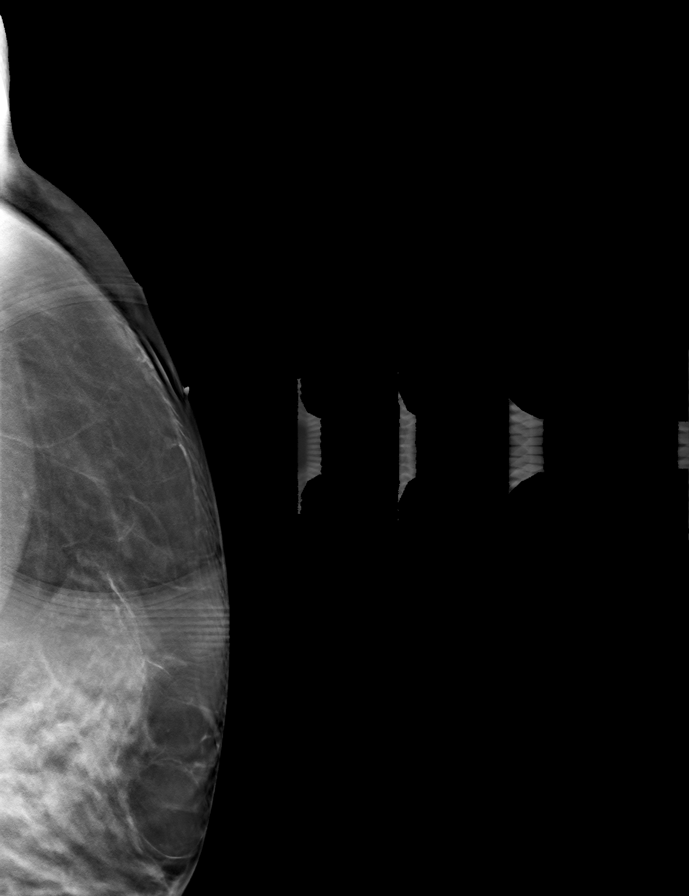
[frame 43/84]
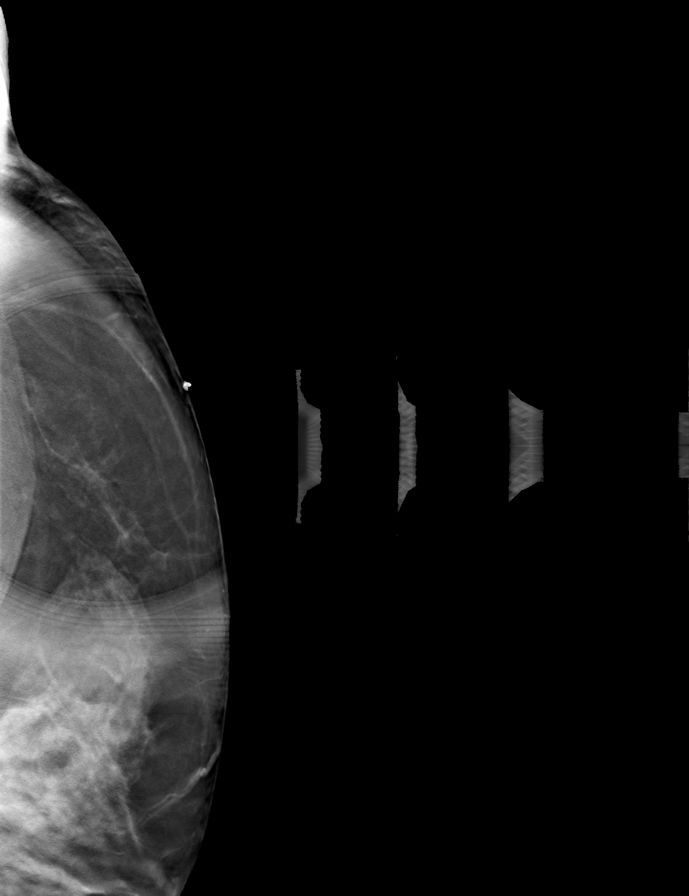

[9 of 40 positions shown; findings below may reference images not displayed]

ACR Breast Density Category c: The breast tissue is heterogeneously
dense, which may obscure small masses.
FINDINGS: No suspicious masses, calcifications, or distortion are identified
in either breast.

On physical exam, no suspicious lumps are identified.

Targeted ultrasound is performed, showing no abnormalities in the
region of the patient's focal pain.
IMPRESSION: No mammographic or sonographic evidence of malignancy.

RECOMMENDATION:
Treatment of the left breast focal pain should be based on clinical
and physical exam given lack of imaging findings. Recommend annual
screening mammography.

I have discussed the findings and recommendations with the patient.
If applicable, a reminder letter will be sent to the patient
regarding the next appointment.

BI-RADS CATEGORY  1: Negative.

## 2021-07-13 ENCOUNTER — Telehealth: Payer: Self-pay | Admitting: Family Medicine

## 2021-07-13 NOTE — Telephone Encounter (Signed)
[facility] faxed refill request for the following medications:  citalopram (CELEXA) 40 MG tablet    Please advise.

## 2021-07-14 ENCOUNTER — Other Ambulatory Visit: Payer: Self-pay

## 2021-07-14 NOTE — Telephone Encounter (Signed)
Per chart note patient established care on  05/15/2021 with  [facility]
